# Patient Record
Sex: Female | Born: 1990 | Race: White | Hispanic: No | Marital: Married
Health system: Southern US, Community
[De-identification: ages and names within clinical notes are randomized; demographics above are authoritative.]

## PROBLEM LIST (undated history)

## (undated) DIAGNOSIS — D649 Anemia, unspecified: Secondary | ICD-10-CM

## (undated) DIAGNOSIS — Z789 Other specified health status: Secondary | ICD-10-CM

## (undated) DIAGNOSIS — R87629 Unspecified abnormal cytological findings in specimens from vagina: Secondary | ICD-10-CM

## (undated) HISTORY — PX: TONSILLECTOMY: SUR1361

## (undated) HISTORY — DX: Unspecified abnormal cytological findings in specimens from vagina: R87.629

---
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
## 2009-03-06 HISTORY — PX: TONSILLECTOMY: SUR1361

## 2009-07-15 ENCOUNTER — Emergency Department (HOSPITAL_COMMUNITY): Admission: EM | Admit: 2009-07-15 | Discharge: 2009-07-15 | Payer: Self-pay | Admitting: Emergency Medicine

## 2009-07-16 ENCOUNTER — Ambulatory Visit (HOSPITAL_COMMUNITY): Admission: RE | Admit: 2009-07-16 | Discharge: 2009-07-16 | Payer: Self-pay | Admitting: Emergency Medicine

## 2010-01-14 ENCOUNTER — Emergency Department (HOSPITAL_COMMUNITY): Admission: EM | Admit: 2010-01-14 | Discharge: 2010-01-14 | Payer: Self-pay | Admitting: Family Medicine

## 2010-05-24 LAB — COMPREHENSIVE METABOLIC PANEL
ALT: 23 U/L (ref 0–35)
AST: 24 U/L (ref 0–37)
Albumin: 4 g/dL (ref 3.5–5.2)
Alkaline Phosphatase: 45 U/L (ref 39–117)
BUN: 21 mg/dL (ref 6–23)
CO2: 21 mEq/L (ref 19–32)
Calcium: 9.2 mg/dL (ref 8.4–10.5)
Chloride: 107 mEq/L (ref 96–112)
Creatinine, Ser: 0.83 mg/dL (ref 0.4–1.2)
GFR calc Af Amer: >60 mL/min (ref >60)
GFR calc non Af Amer: >60 mL/min (ref >60)
Glucose, Bld: 96 mg/dL (ref 70–99)
Potassium: 3.4 mEq/L — ABNORMAL LOW (ref 3.5–5.1)
Sodium: 138 mEq/L (ref 135–145)
Total Bilirubin: 0.4 mg/dL (ref 0.3–1.2)
Total Protein: 7.1 g/dL (ref 6.0–8.3)

## 2010-05-24 LAB — DIFFERENTIAL
Basophils Absolute: 0 10*3/uL (ref 0.0–0.1)
Basophils Relative: 0 % (ref 0–1)
Eosinophils Absolute: 0.3 10*3/uL (ref 0.0–0.7)
Eosinophils Relative: 4 % (ref 0–5)
Lymphocytes Relative: 25 % (ref 12–46)
Lymphs Abs: 2.1 10*3/uL (ref 0.7–4.0)
Monocytes Absolute: 0.4 10*3/uL (ref 0.1–1.0)
Monocytes Relative: 5 % (ref 3–12)
Neutro Abs: 5.5 10*3/uL (ref 1.7–7.7)
Neutrophils Relative %: 66 % (ref 43–77)

## 2010-05-24 LAB — URINALYSIS, ROUTINE W REFLEX MICROSCOPIC
Bilirubin Urine: NEGATIVE
Ketones, ur: NEGATIVE mg/dL
Nitrite: NEGATIVE
Protein, ur: NEGATIVE mg/dL
Specific Gravity, Urine: 1.007 (ref 1.005–1.030)
Urobilinogen, UA: 0.2 mg/dL (ref 0.0–1.0)
pH: 5.5 (ref 5.0–8.0)

## 2010-05-24 LAB — CBC
HCT: 39.7 % (ref 36.0–46.0)
Hemoglobin: 13.3 g/dL (ref 12.0–15.0)
MCHC: 33.6 g/dL (ref 30.0–36.0)
MCV: 93.2 fL (ref 78.0–100.0)
Platelets: 193 10*3/uL (ref 150–400)
RBC: 4.26 MIL/uL (ref 3.87–5.11)
RDW: 12.4 % (ref 11.5–15.5)
WBC: 8.3 10*3/uL (ref 4.0–10.5)

## 2011-04-08 IMAGING — US US ABDOMEN COMPLETE
1 series · 14 of 25 positions shown · non-contrast
Comparison: None.

CLINICAL DATA: Epigastric pain

COMPLETE ABDOMINAL ULTRASOUND

[Series 1: us abdomen complete · 0.23mm/px · 14 of 78 slices shown]
[im 1/78]
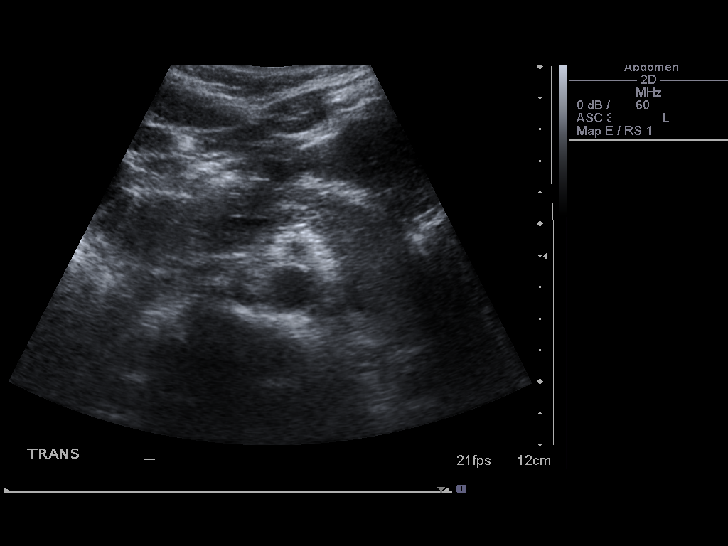
[im 7/78]
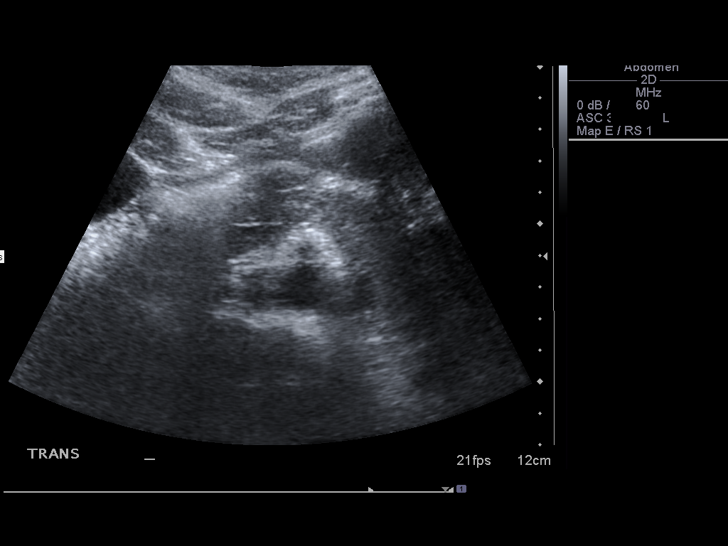
[im 13/78]
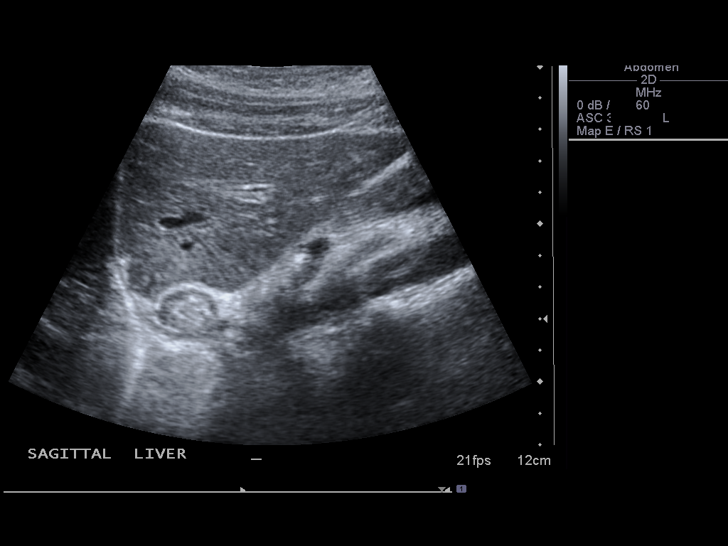
[im 20/78]
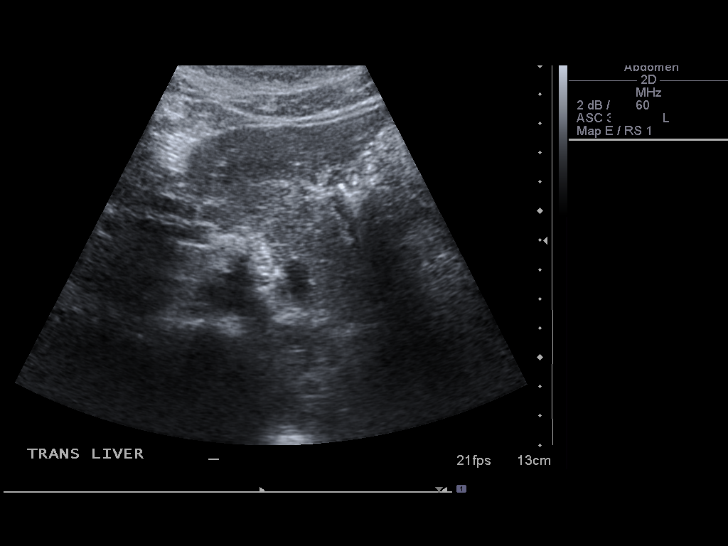
[im 26/78]
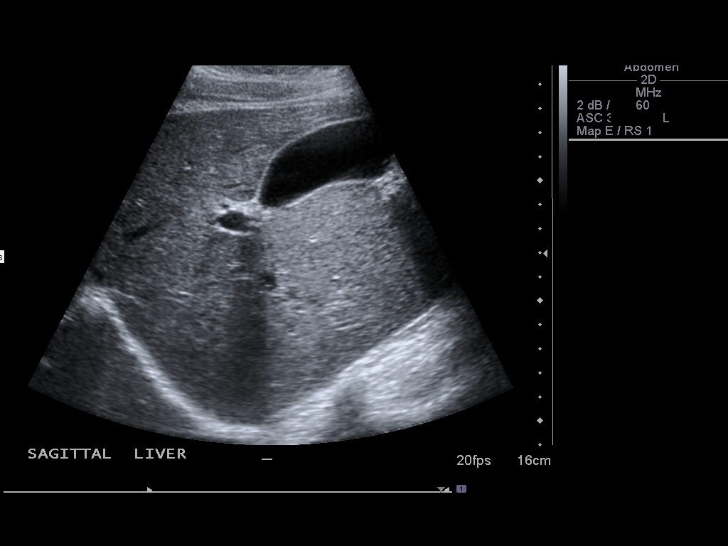
[im 29/78]
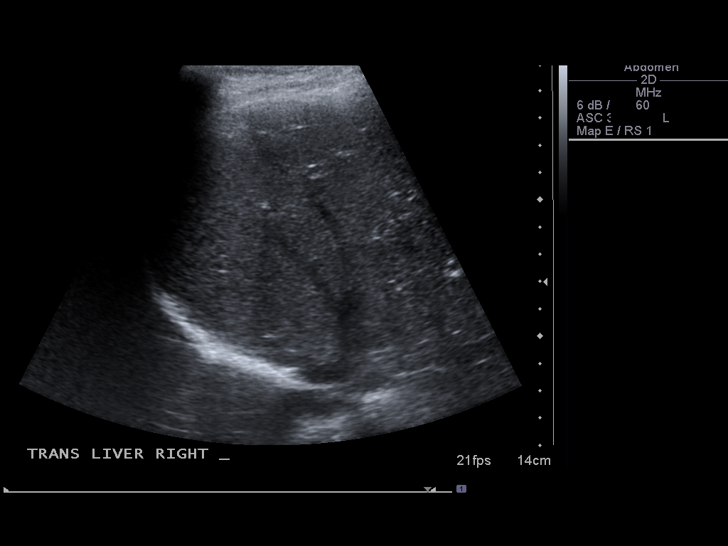
[im 36/78]
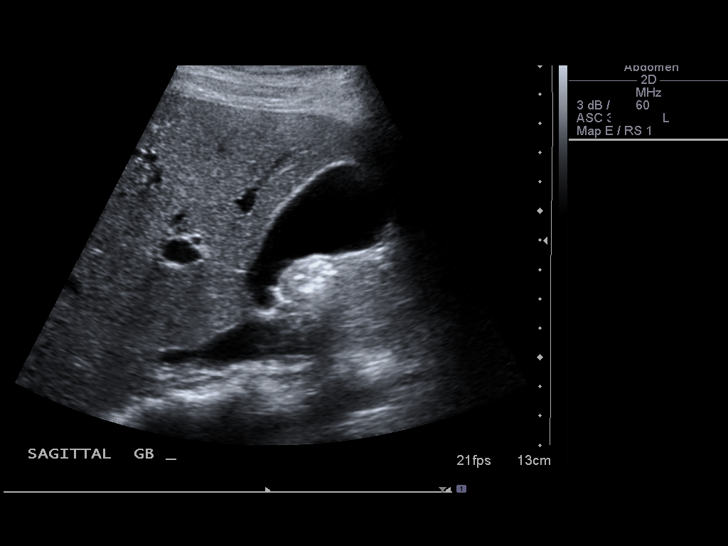
[im 42/78]
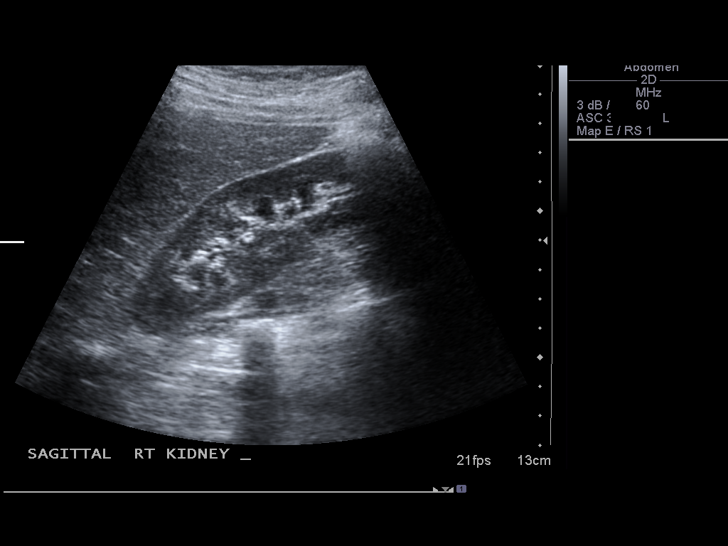
[im 49/78]
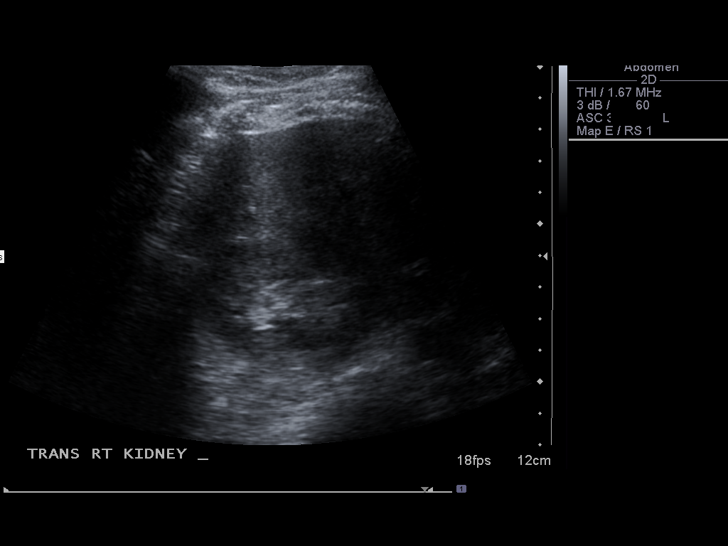
[im 52/78]
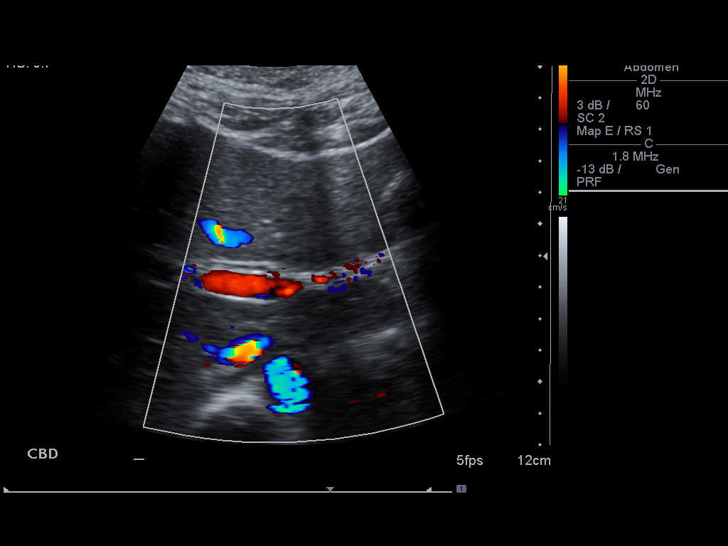
[im 58/78]
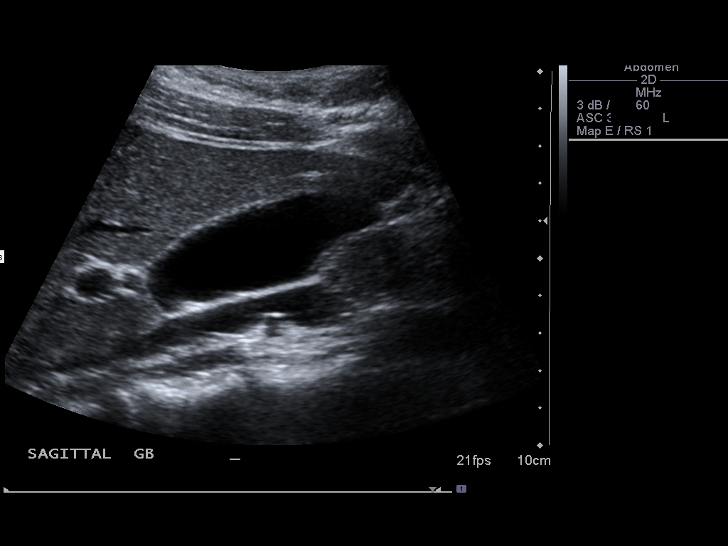
[im 65/78]
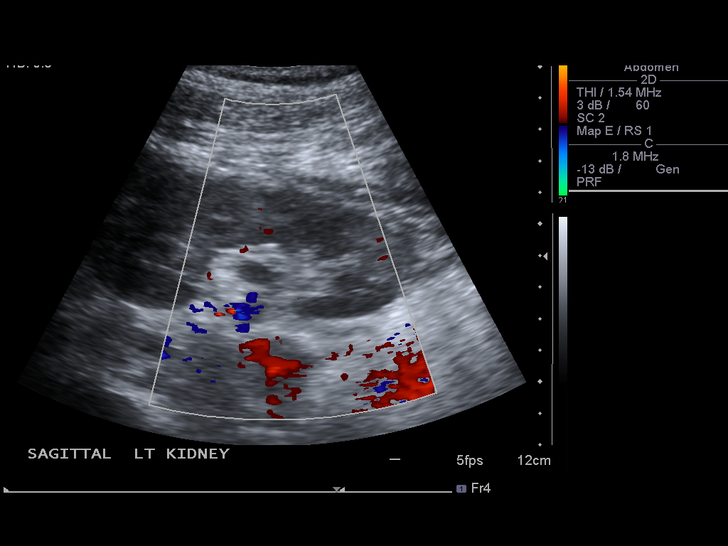
[im 71/78]
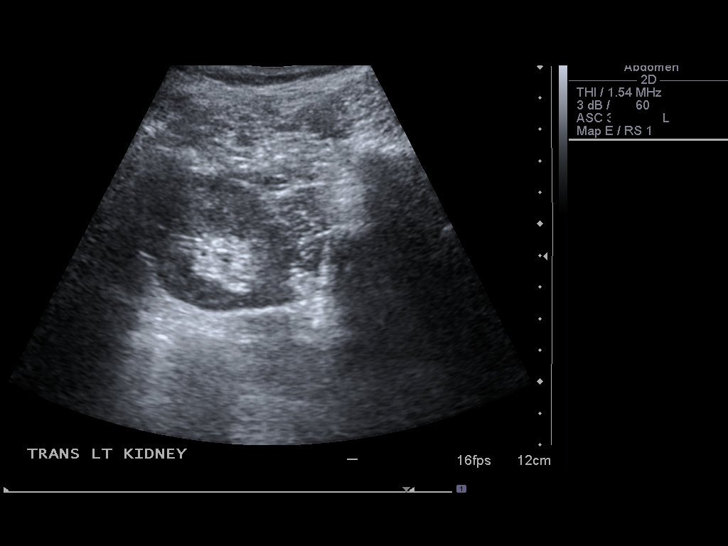
[im 78/78]
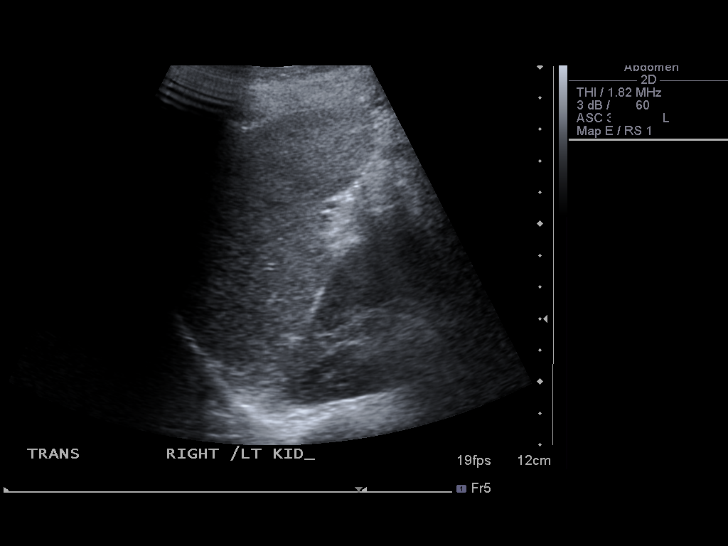

[14 of 25 positions shown; findings below may reference images not displayed]

FINDINGS: Gallbladder:  No gallstones, gallbladder wall thickening, or
pericholecystic fluid.

Common bile duct:  1.4 mm

Liver:  No focal lesion identified.  Within normal limits in
parenchymal echogenicity.

IVC:  Normal

Pancreas:  No focal abnormality seen.

Spleen:  6.5 cm in length.  No lesions.

Right Kidney:  10.6 cm in length.  No hydronephrosis or other
pathology.

Left Kidney:  10.5 cm in length.  No pathology.

Abdominal aorta:  Normal
IMPRESSION: No pathological findings.

## 2013-03-12 LAB — HM PAP SMEAR

## 2013-03-18 ENCOUNTER — Ambulatory Visit (INDEPENDENT_AMBULATORY_CARE_PROVIDER_SITE_OTHER): Payer: 59 | Admitting: Family Medicine

## 2013-03-18 ENCOUNTER — Encounter: Payer: Self-pay | Admitting: Family Medicine

## 2013-03-18 VITALS — Temp 97.9°F | Ht 67.25 in | Wt 150.0 lb

## 2013-03-18 DIAGNOSIS — Z7689 Persons encountering health services in other specified circumstances: Secondary | ICD-10-CM

## 2013-03-18 DIAGNOSIS — K5289 Other specified noninfective gastroenteritis and colitis: Secondary | ICD-10-CM

## 2013-03-18 DIAGNOSIS — Z7189 Other specified counseling: Secondary | ICD-10-CM

## 2013-03-18 DIAGNOSIS — K529 Noninfective gastroenteritis and colitis, unspecified: Secondary | ICD-10-CM

## 2013-03-18 MED ORDER — ONDANSETRON HCL 4 MG PO TABS: 4.0000 mg | ORAL_TABLET | Freq: Three times a day (TID) | ORAL | Status: DC | PRN

## 2013-03-18 NOTE — Progress Notes (Signed)
Chief Complaint  Patient presents with  . Establish Care  . Nausea    diarrhea started this morning     HPI:  Sonia Terry is here to establish care.  Last PCP and physical: [facility], gets yearly physical and utd  Has the following chronic problems and concerns today:  Acute NVD: -diarrhea 2x this morning, nausea -denies: fevers, sore throat, vomiting, hematochezia, abd pain  There are no active problems to display for this patient.  Health Maintenance: -had her flu shot  ROS: See pertinent positives and negatives per HPI.  History reviewed. No pertinent past medical history.  Family History  Problem Relation Age of Onset  . Alcoholism Maternal Grandfather   . Hyperlipidemia Maternal Grandfather   . Hypertension Maternal Grandmother     History   Social History  . Marital Status: Single    Spouse Name: N/A    Number of Children: N/A  . Years of Education: N/A   Social History Main Topics  . Smoking status: None  . Smokeless tobacco: None  . Alcohol Use: Yes     Comment: occ glass wine  . Drug Use: None  . Sexual Activity: Yes    Birth Control/ Protection: Pill   Other Topics Concern  . None   Social History Narrative   Work or School: Investment banker, corporateproperty manager for [employer]      Home Situation:       Spiritual Beliefs: Christian      Lifestyle: regular exercise; diet is healthy                Current outpatient prescriptions:Green Tea, Camillia sinensis, (GREEN TEA EXTRACT PO), Take by mouth as needed., Disp: , Rfl: ;  JUNEL FE 1/20 1-20 MG-MCG tablet, Take 1 tablet by mouth daily. , Disp: , Rfl: ;  Multiple Vitamin (MULTIVITAMIN) tablet, Take 1 tablet by mouth daily., Disp: , Rfl: ;  Ascorbic Acid (VITAMIN C) 100 MG tablet, Take 100 mg by mouth daily., Disp: , Rfl:  ondansetron (ZOFRAN) 4 MG tablet, Take 1 tablet (4 mg total) by mouth every 8 (eight) hours as needed for nausea or vomiting., Disp: 20 tablet, Rfl:  0  EXAM:  Filed Vitals:   03/18/13 0825  Temp: 97.9 F (36.6 C)    Body mass index is 23.32 kg/(m^2).  GENERAL: vitals reviewed and listed above, alert, oriented, appears well hydrated and in no acute distress  HEENT: atraumatic, conjunttiva clear, no obvious abnormalities on inspection of external nose and ears  NECK: no obvious masses on inspection  LUNGS: clear to auscultation bilaterally, no wheezes, rales or rhonchi, good air movement  CV: HRRR, no peripheral edema  ABD: BS+, soft, NTTP  MS: moves all extremities without noticeable abnormality  PSYCH: pleasant and cooperative, no obvious depression or anxiety  ASSESSMENT AND PLAN:  Discussed the following assessment and plan:  Encounter to establish care  Gastroenteritis - Plan: ondansetron (ZOFRAN) 4 MG tablet   -We reviewed the PMH, PSH, FH, SH, Meds and Allergies. -We provided refills for any medications we will prescribe as needed. -We addressed current concerns per orders and patient instructions. -We have asked for records for pertinent exams, studies, vaccines and notes from previous providers. -We have advised patient to follow up per instructions below.   -Patient advised to return or notify a doctor immediately if symptoms worsen or persist or new concerns arise.  Patient Instructions  -We have ordered labs or studies at this visit.  It can take up to 1-2 weeks for results and processing. We will contact you with instructions IF your results are abnormal. Normal results will be released to your [facility]. If you have not heard from [place] or can not find your results in [facility] in 2 weeks please contact our office.  -PLEASE SIGN UP FOR MYCHART TODAY   We recommend the following healthy lifestyle measures: - eat a healthy diet consisting of lots of vegetables, fruits, beans, nuts, seeds, healthy meats such as white chicken and fish and whole grains.  - avoid fried foods, fast food, processed foods, sodas,  red meet and other fattening foods.  - get a least 150 minutes of aerobic exercise per week.   Take zofran if needed for nausea  Loperamide for diarrhea if needed  Plenty of fluids  Follow up as needed      Dixon Luczak R.

## 2013-03-18 NOTE — Patient Instructions (Signed)
-  We have ordered labs or studies at this visit. It can take up to 1-2 weeks for results and processing. We will contact you with instructions IF your results are abnormal. Normal results will be released to your [facility]. If you have not heard from us or can not find your results in [facility] in 2 weeks please contact our office.  -PLEASE SIGN UP FOR MYCHART TODAY   We recommend the following healthy lifestyle measures: - eat a healthy diet consisting of lots of vegetables, fruits, beans, nuts, seeds, healthy meats such as white chicken and fish and whole grains.  - avoid fried foods, fast food, processed foods, sodas, red meet and other fattening foods.  - get a least 150 minutes of aerobic exercise per week.   Take zofran if needed for nausea  Loperamide for diarrhea if needed  Plenty of fluids  Follow up as needed

## 2013-03-18 NOTE — Progress Notes (Signed)
Pre visit review using our clinic review tool, if applicable. No additional management support is needed unless otherwise documented below in the visit note. 

## 2013-05-05 ENCOUNTER — Ambulatory Visit (INDEPENDENT_AMBULATORY_CARE_PROVIDER_SITE_OTHER): Payer: 59 | Admitting: Family Medicine

## 2013-05-05 ENCOUNTER — Encounter: Payer: Self-pay | Admitting: Family Medicine

## 2013-05-05 VITALS — BP 90/68 | Temp 99.0°F | Wt 162.0 lb

## 2013-05-05 DIAGNOSIS — J069 Acute upper respiratory infection, unspecified: Secondary | ICD-10-CM

## 2013-05-05 NOTE — Progress Notes (Signed)
Pre visit review using our clinic review tool, if applicable. No additional management support is needed unless otherwise documented below in the visit note. 

## 2013-05-05 NOTE — Progress Notes (Signed)
Chief Complaint  Patient presents with  . Headache    mucus congestion, scratchy throat, ear congestion    HPI:  -started: 3 days ago -symptoms:nasal congestion, sore throat, cough and above -denies:fever, SOB, NVD, tooth pain -has tried: OTC cold medication -sick contacts/travel/risks: denies flu exposure or Ebola risks  ROS: See pertinent positives and negatives per HPI.  No past medical history on file.  Past Surgical History  Procedure Laterality Date  . Tonsillectomy  2011    Family History  Problem Relation Age of Onset  . Alcoholism Maternal Grandfather   . Hyperlipidemia Maternal Grandfather   . Hypertension Maternal Grandmother     History   Social History  . Marital Status: Single    Spouse Name: N/A    Number of Children: N/A  . Years of Education: N/A   Social History Main Topics  . Smoking status: Never Smoker   . Smokeless tobacco: Not on file  . Alcohol Use: Yes     Comment: occ glass wine  . Drug Use: Not on file  . Sexual Activity: Yes    Birth Control/ Protection: Pill   Other Topics Concern  . 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 , Disp: , Rfl: ;  Multiple Vitamin (MULTIVITAMIN) tablet, Take 1 tablet by mouth daily., Disp: , Rfl:  ondansetron (ZOFRAN) 4 MG tablet, Take 1 tablet (4 mg total) by mouth every 8 (eight) hours as needed for nausea or vomiting., Disp: 20 tablet, Rfl: 0  EXAM:  Filed Vitals:   05/05/13 1413  BP: 90/68  Temp: 99 F (37.2 C)    Body mass index is 25.19  kg/(m^2).  GENERAL: vitals reviewed and listed above, alert, oriented, appears well hydrated and in no acute distress  HEENT: atraumatic, conjunttiva clear, no obvious abnormalities on inspection of external nose and ears, normal appearance of ear canals and TMs, clear nasal congestion, mild post oropharyngeal erythema with PND, no tonsillar edema or exudate, no sinus TTP  NECK: no obvious masses on inspection  LUNGS: clear to auscultation bilaterally, no wheezes, rales or rhonchi, good air movement  CV: HRRR, no peripheral edema  MS: moves all extremities without noticeable abnormality  PSYCH: pleasant and cooperative, no obvious depression or anxiety  ASSESSMENT AND PLAN:  Discussed the following assessment and plan:  Upper respiratory infection  -given HPI and exam findings today, a serious infection or illness is unlikely. We discussed potential etiologies, with VURI being most likely, and advised supportive care and monitoring. We discussed treatment side effects, likely course, antibiotic misuse, transmission, and signs of developing a serious illness. -of course, we advised to return or notify a doctor immediately if symptoms worsen or persist or new concerns arise.    Patient Instructions  INSTRUCTIONS FOR UPPER RESPIRATORY INFECTION:  -plenty of rest and fluids  -nasal saline wash 2-3 times daily (use prepackaged nasal saline or bottled/distilled water  if making your own)   -clean nose with nasal saline before using the nasal steroid or sinex  -nasocort for 21 days  -can use sinex or afrin nasal spray for drainage and nasal congestion - but do NOT use longer then 3-4 days  -can use tylenol or ibuprofen as directed for aches and sorethroat  -in the winter time, using a humidifier at night is helpful (please follow cleaning instructions)  -if you are taking a cough medication - use only as directed, may also try a teaspoon of honey to coat the throat and throat  lozenges  -for sore throat, salt water gargles can help  -follow up if you have fevers, facial pain, tooth pain, difficulty breathing or are worsening or not getting better in 5-7 days      Oretha Weismann R.

## 2013-05-05 NOTE — Patient Instructions (Signed)
INSTRUCTIONS FOR UPPER RESPIRATORY INFECTION:  -plenty of rest and fluids  -nasal saline wash 2-3 times daily (use prepackaged nasal saline or bottled/distilled water if making your own)   -clean nose with nasal saline before using the nasal steroid or sinex  -nasocort for 21 days  -can use sinex or afrin nasal spray for drainage and nasal congestion - but do NOT use longer then 3-4 days  -can use tylenol or ibuprofen as directed for aches and sorethroat  -in the winter time, using a humidifier at night is helpful (please follow cleaning instructions)  -if you are taking a cough medication - use only as directed, may also try a teaspoon of honey to coat the throat and throat lozenges  -for sore throat, salt water gargles can help  -follow up if you have fevers, facial pain, tooth pain, difficulty breathing or are worsening or not getting better in 5-7 days

## 2013-05-29 ENCOUNTER — Telehealth: Payer: Self-pay | Admitting: Internal Medicine

## 2013-05-29 NOTE — Telephone Encounter (Signed)
Attemped to call pt and "talk" appeared but no one was on the line.

## 2013-07-31 ENCOUNTER — Ambulatory Visit (INDEPENDENT_AMBULATORY_CARE_PROVIDER_SITE_OTHER): Payer: 59 | Admitting: Family Medicine

## 2013-07-31 ENCOUNTER — Encounter: Payer: Self-pay | Admitting: Family Medicine

## 2013-07-31 VITALS — BP 102/76 | HR 69 | Temp 98.2°F | Ht 67.25 in

## 2013-07-31 DIAGNOSIS — R6889 Other general symptoms and signs: Secondary | ICD-10-CM

## 2013-07-31 LAB — BASIC METABOLIC PANEL
BUN: 19 mg/dL (ref 6–23)
CALCIUM: 9.2 mg/dL (ref 8.4–10.5)
CO2: 24 mEq/L (ref 19–32)
CREATININE: 0.9 mg/dL (ref 0.4–1.2)
Chloride: 109 mEq/L (ref 96–112)
GFR: 80.96 mL/min (ref >60.00)
Glucose, Bld: 79 mg/dL (ref 70–99)
Potassium: 3.8 mEq/L (ref 3.5–5.1)
SODIUM: 139 meq/L (ref 135–145)

## 2013-07-31 LAB — CBC WITH DIFFERENTIAL/PLATELET
BASOS ABS: 0 10*3/uL (ref 0.0–0.1)
BASOS PCT: 0.6 % (ref 0.0–3.0)
Eosinophils Absolute: 0.2 10*3/uL (ref 0.0–0.7)
Eosinophils Relative: 2.9 % (ref 0.0–5.0)
HCT: 36.1 % (ref 36.0–46.0)
HEMOGLOBIN: 12.4 g/dL (ref 12.0–15.0)
LYMPHS PCT: 29.8 % (ref 12.0–46.0)
Lymphs Abs: 1.8 10*3/uL (ref 0.7–4.0)
MCHC: 34.2 g/dL (ref 30.0–36.0)
MCV: 92.2 fl (ref 78.0–100.0)
MONOS PCT: 4 % (ref 3.0–12.0)
Monocytes Absolute: 0.2 10*3/uL (ref 0.1–1.0)
NEUTROS ABS: 3.8 10*3/uL (ref 1.4–7.7)
NEUTROS PCT: 62.7 % (ref 43.0–77.0)
Platelets: 197 10*3/uL (ref 150.0–400.0)
RBC: 3.91 Mil/uL (ref 3.87–5.11)
RDW: 13.3 % (ref 11.5–15.5)
WBC: 6.1 10*3/uL (ref 4.0–10.5)

## 2013-07-31 LAB — LIPID PANEL
Cholesterol: 190 mg/dL (ref 0–200)
HDL: 74.9 mg/dL (ref >39.00)
LDL Cholesterol: 100 mg/dL — ABNORMAL HIGH (ref 0–99)
TRIGLYCERIDES: 78 mg/dL (ref 0.0–149.0)
Total CHOL/HDL Ratio: 3
VLDL: 15.6 mg/dL (ref 0.0–40.0)

## 2013-07-31 LAB — T4, FREE: FREE T4: 0.7 ng/dL (ref 0.60–1.60)

## 2013-07-31 LAB — HEMOGLOBIN A1C: HEMOGLOBIN A1C: 5.1 % (ref 4.6–6.5)

## 2013-07-31 LAB — TSH: TSH: 1.34 u[IU]/mL (ref 0.35–4.50)

## 2013-07-31 NOTE — Progress Notes (Signed)
No chief complaint on file.   HPI:  Follow up:  1) ? Thyroid issues: -always cold and feels like hair doesn't grow well, sometimes tired -feels like struggles with weight -denies: constipation, fevers, swelling, hair loss, skin problems, palpitations  ROS: See pertinent positives and negatives per HPI.  No past medical history on file.  Past Surgical History  Procedure Laterality Date  . Tonsillectomy  2011    Family History  Problem Relation Age of Onset  . Alcoholism Maternal Grandfather   . Hyperlipidemia Maternal Grandfather   . Hypertension Maternal Grandmother     History   Social History  . Marital Status: Single    Spouse Name: N/A    Number of Children: N/A  . Years of Education: N/A   Social History Main Topics  . Smoking status: Never Smoker   . Smokeless tobacco: None  . Alcohol Use: Yes     Comment: occ glass wine  . Drug Use: None  . Sexual Activity: Yes    Birth Control/ Protection: Pill   Other Topics Concern  . None   Social History Narrative   Work or School: Investment banker, corporate for [employer]      Home Situation:       Spiritual Beliefs: Christian      Lifestyle: regular exercise; diet is healthy                Current outpatient prescriptions:Green Tea, Camillia sinensis, (GREEN TEA EXTRACT PO), Take by mouth as needed., Disp: , Rfl: ;  JUNEL FE 1/20 1-20 MG-MCG tablet, Take 1 tablet by mouth daily. , Disp: , Rfl: ;  NON FORMULARY, Elcarnitine, Disp: , Rfl:   EXAM:  Filed Vitals:   07/31/13 0812  BP: 102/76  Pulse: 69  Temp: 98.2 F (36.8 C)    Body mass index is 0.00 kg/(m^2).  GENERAL: vitals reviewed and listed above, alert, oriented, appears well hydrated and in no acute distress  HEENT: atraumatic, conjunttiva clear, no obvious abnormalities on inspection of external nose and ears  NECK: no obvious masses on inspection  LUNGS: clear to auscultation bilaterally, no wheezes, rales or rhonchi, good air  movement  CV: HRRR, no peripheral edema  MS: moves all extremities without noticeable abnormality  PSYCH: pleasant and cooperative, no obvious depression or anxiety  ASSESSMENT AND PLAN:  Discussed the following assessment and plan:  Cold intolerance - Plan: TSH, T4, Free, Basic metabolic panel, CBC with Differential, Lipid Panel, Hemoglobin A1c  -we discussed possible serious and likely etiologies, workup and treatment, treatment risks and return precautions -after this discussion, Sonia Terry opted for labs per orders -follow up advised as needed and pending labs -of course, we advised Sonia Terry  to return or notify a doctor immediately if symptoms worsen or persist or new concerns arise.  .  -Patient advised to return or notify a doctor immediately if symptoms worsen or persist or new concerns arise.  Patient Instructions  -We have ordered labs or studies at this visit. It can take up to 1-2 weeks for results and processing. We will contact you with instructions IF your results are abnormal. Normal results will be released to your [facility]. If you have not heard from [place] or can not find your results in [facility] in 2 weeks please contact our office.            Terressa Koyanagi

## 2013-07-31 NOTE — Patient Instructions (Signed)
-  We have ordered labs or studies at this visit. It can take up to 1-2 weeks for results and processing. We will contact you with instructions IF your results are abnormal. Normal results will be released to your MYCHART. If you have not heard from us or can not find your results in MYCHART in 2 weeks please contact our office.        

## 2013-07-31 NOTE — Progress Notes (Signed)
Pre visit review using our clinic review tool, if applicable. No additional management support is needed unless otherwise documented below in the visit note. 

## 2013-08-18 ENCOUNTER — Encounter: Payer: Self-pay | Admitting: Family Medicine

## 2013-12-19 ENCOUNTER — Other Ambulatory Visit: Payer: Self-pay

## 2016-03-06 NOTE — L&D Delivery Note (Signed)
Delivery Note Pt pushed for a little over an hour and at 3:19 PM a healthy female was delivered via Vaginal, Spontaneous Delivery (Presentation: ROA  ).  APGAR: 9, 9; weight  pending.   Placenta status: delivered spontraneously .  Cord:  with the following complications: None Compound presentation of left arm. Baby had copious terminal meconium right after delivery.  Anesthesia:  Epidural Episiotomy: None Lacerations: 1st degree;Periurethral Suture Repair: 3.0 vicryl rapide Est. Blood Loss (mL):  275mL Mom to postpartum.  Baby to Couplet care / Skin to Skin. D/w pt circumcision and they desire to proceed in the hospital and have paid at office.  Oliver PilaKathy W Tyyne Cliett 12/20/2016, 3:56 PM

## 2016-05-26 LAB — OB RESULTS CONSOLE ANTIBODY SCREEN: Antibody Screen: NEGATIVE

## 2016-05-26 LAB — OB RESULTS CONSOLE GC/CHLAMYDIA
CHLAMYDIA, DNA PROBE: NEGATIVE
Gonorrhea: NEGATIVE

## 2016-05-26 LAB — OB RESULTS CONSOLE HIV ANTIBODY (ROUTINE TESTING): HIV: NONREACTIVE

## 2016-05-26 LAB — OB RESULTS CONSOLE HEPATITIS B SURFACE ANTIGEN: HEP B S AG: NEGATIVE

## 2016-05-26 LAB — OB RESULTS CONSOLE RPR: RPR: NONREACTIVE

## 2016-05-26 LAB — OB RESULTS CONSOLE RUBELLA ANTIBODY, IGM: RUBELLA: IMMUNE

## 2016-05-26 LAB — OB RESULTS CONSOLE ABO/RH: RH TYPE: POSITIVE

## 2016-11-22 LAB — OB RESULTS CONSOLE GBS: GBS: NEGATIVE

## 2016-11-23 ENCOUNTER — Encounter: Payer: Self-pay | Admitting: Family Medicine

## 2016-12-20 ENCOUNTER — Inpatient Hospital Stay (HOSPITAL_COMMUNITY): Payer: 59 | Admitting: Anesthesiology

## 2016-12-20 ENCOUNTER — Encounter (HOSPITAL_COMMUNITY): Payer: Self-pay | Admitting: *Deleted

## 2016-12-20 ENCOUNTER — Inpatient Hospital Stay (HOSPITAL_COMMUNITY)
Admission: AD | Admit: 2016-12-20 | Discharge: 2016-12-22 | DRG: 807 | Disposition: A | Discharge status: Home / Self Care | Payer: 59 | Source: Ambulatory Visit | Attending: Obstetrics and Gynecology | Admitting: Obstetrics and Gynecology

## 2016-12-20 DIAGNOSIS — Z3A39 39 weeks gestation of pregnancy: Secondary | ICD-10-CM

## 2016-12-20 DIAGNOSIS — O26893 Other specified pregnancy related conditions, third trimester: Secondary | ICD-10-CM | POA: Diagnosis present

## 2016-12-20 DIAGNOSIS — O326XX0 Maternal care for compound presentation, not applicable or unspecified: Secondary | ICD-10-CM | POA: Diagnosis present

## 2016-12-20 HISTORY — DX: Other specified health status: Z78.9

## 2016-12-20 LAB — RPR: RPR Ser Ql: NONREACTIVE

## 2016-12-20 LAB — ABO/RH: ABO/RH(D): O POS

## 2016-12-20 LAB — CBC
HEMATOCRIT: 32.8 % — AB (ref 36.0–46.0)
HEMOGLOBIN: 11.1 g/dL — AB (ref 12.0–15.0)
MCH: 30.5 pg (ref 26.0–34.0)
MCHC: 33.8 g/dL (ref 30.0–36.0)
MCV: 90.1 fL (ref 78.0–100.0)
Platelets: 163 10*3/uL (ref 150–400)
RBC: 3.64 MIL/uL — ABNORMAL LOW (ref 3.87–5.11)
RDW: 14.3 % (ref 11.5–15.5)
WBC: 12.6 10*3/uL — ABNORMAL HIGH (ref 4.0–10.5)

## 2016-12-20 LAB — TYPE AND SCREEN
ABO/RH(D): O POS
Antibody Screen: NEGATIVE

## 2016-12-20 MED ORDER — IBUPROFEN 600 MG PO TABS
600.0000 mg | ORAL_TABLET | Freq: Four times a day (QID) | ORAL | Status: DC
  Administered 2016-12-20 – 2016-12-22 (×8): 600 mg via ORAL
  Filled 2016-12-20 (×8): qty 1

## 2016-12-20 MED ORDER — ZOLPIDEM TARTRATE 5 MG PO TABS: 5.0000 mg | ORAL_TABLET | Freq: Every evening | ORAL | Status: DC | PRN

## 2016-12-20 MED ORDER — LIDOCAINE HCL (PF) 1 % IJ SOLN
30.0000 mL | INTRAMUSCULAR | Status: DC | PRN
  Filled 2016-12-20: qty 30

## 2016-12-20 MED ORDER — FLEET ENEMA 7-19 GM/118ML RE ENEM: 1.0000 | ENEMA | RECTAL | Status: DC | PRN

## 2016-12-20 MED ORDER — ACETAMINOPHEN 325 MG PO TABS: 650.0000 mg | ORAL_TABLET | ORAL | Status: DC | PRN

## 2016-12-20 MED ORDER — EPHEDRINE 5 MG/ML INJ
10.0000 mg | INTRAVENOUS | Status: DC | PRN
  Filled 2016-12-20: qty 2

## 2016-12-20 MED ORDER — BENZOCAINE-MENTHOL 20-0.5 % EX AERO
1.0000 "application " | INHALATION_SPRAY | CUTANEOUS | Status: DC | PRN
  Administered 2016-12-20 – 2016-12-21 (×2): 1 via TOPICAL
  Filled 2016-12-20 (×2): qty 56

## 2016-12-20 MED ORDER — OXYCODONE-ACETAMINOPHEN 5-325 MG PO TABS: 2.0000 | ORAL_TABLET | ORAL | Status: DC | PRN

## 2016-12-20 MED ORDER — TERBUTALINE SULFATE 1 MG/ML IJ SOLN
0.2500 mg | Freq: Once | INTRAMUSCULAR | Status: DC | PRN
  Filled 2016-12-20: qty 1

## 2016-12-20 MED ORDER — SIMETHICONE 80 MG PO CHEW: 80.0000 mg | CHEWABLE_TABLET | ORAL | Status: DC | PRN

## 2016-12-20 MED ORDER — DIBUCAINE 1 % RE OINT
1.0000 "application " | TOPICAL_OINTMENT | RECTAL | Status: DC | PRN
  Administered 2016-12-20: 1 via RECTAL
  Filled 2016-12-20: qty 28

## 2016-12-20 MED ORDER — PRENATAL MULTIVITAMIN CH
1.0000 | ORAL_TABLET | Freq: Every day | ORAL | Status: DC
  Administered 2016-12-21 – 2016-12-22 (×2): 1 via ORAL
  Filled 2016-12-20 (×2): qty 1

## 2016-12-20 MED ORDER — COCONUT OIL OIL
1.0000 "application " | TOPICAL_OIL | Status: DC | PRN
  Administered 2016-12-22: 1 via TOPICAL
  Filled 2016-12-20: qty 120

## 2016-12-20 MED ORDER — SOD CITRATE-CITRIC ACID 500-334 MG/5ML PO SOLN: 30.0000 mL | ORAL | Status: DC | PRN

## 2016-12-20 MED ORDER — TETANUS-DIPHTH-ACELL PERTUSSIS 5-2.5-18.5 LF-MCG/0.5 IM SUSP: 0.5000 mL | Freq: Once | INTRAMUSCULAR | Status: DC

## 2016-12-20 MED ORDER — FENTANYL 2.5 MCG/ML BUPIVACAINE 1/10 % EPIDURAL INFUSION (WH - ANES)
INTRAMUSCULAR | Status: AC
  Filled 2016-12-20: qty 100

## 2016-12-20 MED ORDER — OXYCODONE HCL 5 MG PO TABS: 5.0000 mg | ORAL_TABLET | ORAL | Status: DC | PRN

## 2016-12-20 MED ORDER — LACTATED RINGERS IV SOLN
INTRAVENOUS | Status: DC
  Administered 2016-12-20: 03:00:00 via INTRAVENOUS

## 2016-12-20 MED ORDER — PHENYLEPHRINE 40 MCG/ML (10ML) SYRINGE FOR IV PUSH (FOR BLOOD PRESSURE SUPPORT)
PREFILLED_SYRINGE | INTRAVENOUS | Status: AC
  Filled 2016-12-20: qty 20

## 2016-12-20 MED ORDER — PHENYLEPHRINE 40 MCG/ML (10ML) SYRINGE FOR IV PUSH (FOR BLOOD PRESSURE SUPPORT)
80.0000 ug | PREFILLED_SYRINGE | INTRAVENOUS | Status: DC | PRN
  Filled 2016-12-20: qty 5

## 2016-12-20 MED ORDER — LIDOCAINE HCL (PF) 1 % IJ SOLN
INTRAMUSCULAR | Status: AC
  Filled 2016-12-20: qty 30

## 2016-12-20 MED ORDER — LACTATED RINGERS IV SOLN
500.0000 mL | INTRAVENOUS | Status: DC | PRN
  Administered 2016-12-20: 500 mL via INTRAVENOUS

## 2016-12-20 MED ORDER — ONDANSETRON HCL 4 MG/2ML IJ SOLN: 4.0000 mg | INTRAMUSCULAR | Status: DC | PRN

## 2016-12-20 MED ORDER — OXYCODONE-ACETAMINOPHEN 5-325 MG PO TABS: 1.0000 | ORAL_TABLET | ORAL | Status: DC | PRN

## 2016-12-20 MED ORDER — LIDOCAINE HCL (PF) 1 % IJ SOLN
INTRAMUSCULAR | Status: DC | PRN
  Administered 2016-12-20 (×2): 5 mL

## 2016-12-20 MED ORDER — OXYTOCIN BOLUS FROM INFUSION
500.0000 mL | Freq: Once | INTRAVENOUS | Status: AC
  Administered 2016-12-20: 500 mL via INTRAVENOUS

## 2016-12-20 MED ORDER — WITCH HAZEL-GLYCERIN EX PADS
1.0000 "application " | MEDICATED_PAD | CUTANEOUS | Status: DC | PRN
  Administered 2016-12-20: 1 via TOPICAL

## 2016-12-20 MED ORDER — BUTORPHANOL TARTRATE 1 MG/ML IJ SOLN
INTRAMUSCULAR | Status: AC
  Filled 2016-12-20: qty 1

## 2016-12-20 MED ORDER — ONDANSETRON HCL 4 MG/2ML IJ SOLN: 4.0000 mg | Freq: Four times a day (QID) | INTRAMUSCULAR | Status: DC | PRN

## 2016-12-20 MED ORDER — OXYTOCIN 40 UNITS IN LACTATED RINGERS INFUSION - SIMPLE MED
1.0000 m[IU]/min | INTRAVENOUS | Status: DC
  Administered 2016-12-20: 2 m[IU]/min via INTRAVENOUS

## 2016-12-20 MED ORDER — DIPHENHYDRAMINE HCL 25 MG PO CAPS: 25.0000 mg | ORAL_CAPSULE | Freq: Four times a day (QID) | ORAL | Status: DC | PRN

## 2016-12-20 MED ORDER — SODIUM BICARBONATE 8.4 % IV SOLN
INTRAVENOUS | Status: DC | PRN
  Administered 2016-12-20: 5 mL via EPIDURAL

## 2016-12-20 MED ORDER — OXYCODONE HCL 5 MG PO TABS: 10.0000 mg | ORAL_TABLET | ORAL | Status: DC | PRN

## 2016-12-20 MED ORDER — OXYTOCIN 40 UNITS IN LACTATED RINGERS INFUSION - SIMPLE MED
INTRAVENOUS | Status: AC
  Filled 2016-12-20: qty 1000

## 2016-12-20 MED ORDER — OXYTOCIN 40 UNITS IN LACTATED RINGERS INFUSION - SIMPLE MED: 2.5000 [IU]/h | INTRAVENOUS | Status: DC

## 2016-12-20 MED ORDER — SENNOSIDES-DOCUSATE SODIUM 8.6-50 MG PO TABS
2.0000 | ORAL_TABLET | ORAL | Status: DC
  Administered 2016-12-20 – 2016-12-21 (×2): 2 via ORAL
  Filled 2016-12-20 (×2): qty 2

## 2016-12-20 MED ORDER — BUTORPHANOL TARTRATE 1 MG/ML IJ SOLN
1.0000 mg | Freq: Once | INTRAMUSCULAR | Status: AC
  Administered 2016-12-20: 1 mg via INTRAVENOUS

## 2016-12-20 MED ORDER — ONDANSETRON HCL 4 MG PO TABS: 4.0000 mg | ORAL_TABLET | ORAL | Status: DC | PRN

## 2016-12-20 MED ORDER — FENTANYL 2.5 MCG/ML BUPIVACAINE 1/10 % EPIDURAL INFUSION (WH - ANES)
14.0000 mL/h | INTRAMUSCULAR | Status: DC | PRN
  Administered 2016-12-20 (×2): 14 mL/h via EPIDURAL
  Filled 2016-12-20: qty 100

## 2016-12-20 MED ORDER — LACTATED RINGERS IV SOLN
500.0000 mL | Freq: Once | INTRAVENOUS | Status: AC
  Administered 2016-12-20: 500 mL via INTRAVENOUS

## 2016-12-20 MED ORDER — DIPHENHYDRAMINE HCL 50 MG/ML IJ SOLN: 12.5000 mg | INTRAMUSCULAR | Status: DC | PRN

## 2016-12-20 NOTE — Anesthesia Procedure Notes (Signed)
Epidural Patient location during procedure: OB  Staffing Anesthesiologist: Nashawn Hillock Performed: anesthesiologist   Preanesthetic Checklist Completed: patient identified, site marked, surgical consent, pre-op evaluation, timeout performed, IV checked, risks and benefits discussed and monitors and equipment checked  Epidural Patient position: sitting Prep: DuraPrep Patient monitoring: heart rate, continuous pulse ox and blood pressure Approach: right paramedian Location: L3-L4 Injection technique: LOR saline  Needle:  Needle type: Tuohy  Needle gauge: 17 G Needle length: 9 cm and 9 Needle insertion depth: 6 cm Catheter type: closed end flexible Catheter size: 20 Guage Catheter at skin depth: 10 cm Test dose: negative  Assessment Events: blood not aspirated, injection not painful, no injection resistance, negative IV test and no paresthesia  Additional Notes Patient identified. Risks/Benefits/Options discussed with patient including but not limited to bleeding, infection, nerve damage, paralysis, failed block, incomplete pain control, headache, blood pressure changes, nausea, vomiting, reactions to medication both or allergic, itching and postpartum back pain. Confirmed with bedside nurse the patient's most recent platelet count. Confirmed with patient that they are not currently taking any anticoagulation, have any bleeding history or any family history of bleeding disorders. Patient expressed understanding and wished to proceed. All questions were answered. Sterile technique was used throughout the entire procedure. Please see nursing notes for vital signs. Test dose was given through epidural needle and negative prior to continuing to dose epidural or start infusion. Warning signs of high block given to the patient including shortness of breath, tingling/numbness in hands, complete motor block, or any concerning symptoms with instructions to call for help. Patient was given  instructions on fall risk and not to get out of bed. All questions and concerns addressed with instructions to call with any issues.     

## 2016-12-20 NOTE — H&P (Signed)
Sonia MilletMegan Blea is a 26 y.o. female G1P0 at 5839 5/7 weeks (EDD 12/22/16 by LMP c/w 9 week US)  who presented in latent phase labor for last 2 days with cervical change to 4-5 cm from office exam of 4 cm yesterday and increasingly strong contractions.  Received an epidural and comfortable.  She had a low lying placenta and resolved. 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 Medical history non-contributory    Past Surgical History:  Procedure Laterality Date  . TONSILLECTOMY     Family History: family history is not on file. Social History:  reports that she has never smoked. She has never used smokeless tobacco. She reports that she does not use drugs. Her alcohol history is not on file.     Maternal Diabetes: No Genetic Screening: Declined Maternal Ultrasounds/Referrals: Normal Fetal Ultrasounds or other Referrals:  None Maternal Substance Abuse:  No Significant Maternal Medications:  None Significant Maternal Lab Results:  None Other Comments:  None  Review of Systems  Gastrointestinal: Negative for abdominal pain.   Maternal Medical History:  Reason for admission: Contractions.   Contractions: Onset was 2 days ago.   Frequency: regular.   Perceived severity is moderate.    Fetal activity: Perceived fetal activity is normal.    Prenatal Complications - Diabetes: none.    Dilation: 6.5 Effacement (%): 90 Station: 0 Exam by:: Dr. Senaida Oresichardson Blood pressure 115/80, pulse 83, temperature 99.8 F (37.7 C), temperature source Oral, resp. rate 18, SpO2 99 %. Maternal Exam:  Uterine Assessment: Contraction strength is moderate.  Contraction frequency is regular.   Abdomen: Patient reports no abdominal tenderness. Fetal presentation: vertex  Introitus: Normal vulva. Normal  vagina.    Physical Exam  Constitutional: She appears well-developed.  Cardiovascular: Normal rate and regular rhythm.   Respiratory: Effort normal.  GI: Soft.  Genitourinary: Vagina normal.  Neurological: She is alert.  Psychiatric: She has a normal mood and affect.   Cervix 90/6+/0 AROM clear  Prenatal labs: ABO, Rh: --/--/O POS, O POS (10/17 0230) Antibody: NEG (10/17 0230) Rubella: Immune (03/23 0000) RPR: Nonreactive (03/23 0000)  HBsAg: Negative (03/23 0000)  HIV: Non-reactive (03/23 0000)  GBS: Negative (09/19 0000)  One hour GCT 144 Three hour GTT WNL  Assessment/Plan: Pt here with labor slowly progressing to active phase.  AROM performed and IUPC placed.  Had been augmented with pitocin overnight and is only on 2 mu.  Will follow progress    Oliver PilaKathy W Kennidy Lamke 12/20/2016, 8:46 AM

## 2016-12-20 NOTE — Anesthesia Preprocedure Evaluation (Signed)

## 2016-12-20 NOTE — H&P (Signed)
Sonia MilletMegan Terry is a 26 y.o. female G1P0 at 39+ with ctx for 48hours and cervical change.  Pt originally wanted natural labor, however admitted knowing she might need to be augmented.   Please see Dr Berenda Moraleichardson's H&P OB History    Gravida Para Term Preterm AB Living   1             SAB TAB Ectopic Multiple Live Births                G1 present  Past Medical History:  Diagnosis Date  . Medical history non-contributory    Past Surgical History:  Procedure Laterality Date  . TONSILLECTOMY     Family History: family history is not on file. Social History:  reports that she has never smoked. She has never used smokeless tobacco. She reports that she does not use drugs. Her alcohol history is not on file.     Maternal Diabetes: No Genetic Screening:  Maternal Ultrasounds/Referrals:  Fetal Ultrasounds or other Referrals:   Maternal Substance Abuse:   Significant Maternal Medications:   Significant Maternal Lab Results:   Other Comments:    Review of Systems  Constitutional: Negative.   HENT: Negative.   Eyes: Negative.   Respiratory: Negative.   Cardiovascular: Negative.   Gastrointestinal: Positive for abdominal pain.  Genitourinary: Negative.   Musculoskeletal: Negative.   Skin: Negative.   Neurological: Negative.   Psychiatric/Behavioral: Negative.    Maternal Medical History:  Reason for admission: Contractions.   Fetal activity: Perceived fetal activity is normal.    Prenatal Complications - Diabetes: none.    Dilation: 6 Effacement (%): 90 Station: 0 Exam by:: Lorn Junes. Goodman, RN Blood pressure 116/69, pulse 86, temperature 98.8 F (37.1 C), temperature source Oral, resp. rate 16, SpO2 99 %. Maternal Exam:  Abdomen: Patient reports generalized tenderness.  Fundal height is appropriate for gestation.   Fetal presentation: vertex  Introitus: Normal vulva. Normal vagina.    Physical Exam  Constitutional: She is oriented to person, place, and time. She  appears well-developed and well-nourished.  HENT:  Head: Normocephalic and atraumatic.  Cardiovascular: Normal rate and regular rhythm.   Respiratory: Effort normal and breath sounds normal. No respiratory distress. She has no wheezes.  GI: Soft. Bowel sounds are normal. She exhibits no distension. There is generalized tenderness.  Musculoskeletal: Normal range of motion.  Neurological: She is alert and oriented to person, place, and time.  Skin: Skin is warm and dry.  Psychiatric: She has a normal mood and affect. Her behavior is normal.    Prenatal labs: ABO, Rh: --/--/O POS, O POS (10/17 0230) Antibody: NEG (10/17 0230) Rubella: Immune (03/23 0000) RPR: Nonreactive (03/23 0000)  HBsAg: Negative (03/23 0000)  HIV: Non-reactive (03/23 0000)  GBS: Negative (09/19 0000)   Assessment/Plan: 26yo G1P0 at 39+ admitted with cervical change for augmentation of labor Epidural prn Augment with AROM/pitocin gbbs neg Expect SVD   Sonia Terry 12/20/2016, 6:54 AM

## 2016-12-20 NOTE — Progress Notes (Signed)
Patient ID: Sonia SohoMegan Gilkison, female   DOB: 05-Apr-1990, 26 y.o.   MRN: [id number] Pt feeling no pressure s/p epidural redose  afeb vss Category 1 tracing  cervix 10/c/+1-+2   Tried to push with reasonable effort, will allow to labor a little lower for 30-40 minutes  and then begin pushing

## 2016-12-20 NOTE — MAU Note (Signed)
Contractions since 2100, positive FM

## 2016-12-20 NOTE — Anesthesia Pain Management Evaluation Note (Signed)
  CRNA Pain Management Visit Note  Patient: Sonia Terry, 26 y.o., female  "Hello I am a member of the anesthesia team at [facility]. We have an anesthesia team available at all times to provide care throughout the hospital, including epidural management and anesthesia for C-section. I don't know your plan for the delivery whether it a natural birth, water birth, IV sedation, nitrous supplementation, doula or epidural, but we want to meet your pain goals."   1.Was your pain managed to your expectations on prior hospitalizations?   No prior hospitalizations  2.What is your expectation for pain management during this hospitalization?     Epidural  3.How can we help you reach that goal? Epidural in place and working well  Record the patient's initial score and the patient's pain goal.   Pain: 0  Pain Goal: 5 [facility] wants you to be able to say your pain was always managed very well.  Herny Scurlock 12/20/2016

## 2016-12-20 NOTE — Progress Notes (Signed)
Shift change report at Pts bedside when I noticed mom getting tearful. Support and encouragement given as well as a nap recommended.Mother states '' I'm so exhausted" Family at bedside as well and were very supportive.

## 2016-12-21 LAB — CBC
HEMATOCRIT: 27.2 % — AB (ref 36.0–46.0)
Hemoglobin: 9.4 g/dL — ABNORMAL LOW (ref 12.0–15.0)
MCH: 31.3 pg (ref 26.0–34.0)
MCHC: 34.6 g/dL (ref 30.0–36.0)
MCV: 90.7 fL (ref 78.0–100.0)
PLATELETS: 139 10*3/uL — AB (ref 150–400)
RBC: 3 MIL/uL — ABNORMAL LOW (ref 3.87–5.11)
RDW: 14.5 % (ref 11.5–15.5)
WBC: 14.3 10*3/uL — AB (ref 4.0–10.5)

## 2016-12-21 MED ORDER — PSEUDOEPHEDRINE HCL ER 120 MG PO TB12
120.0000 mg | ORAL_TABLET | Freq: Every day | ORAL | Status: DC | PRN
  Filled 2016-12-21: qty 1

## 2016-12-21 MED ORDER — GUAIFENESIN ER 600 MG PO TB12
600.0000 mg | ORAL_TABLET | Freq: Two times a day (BID) | ORAL | Status: DC | PRN
  Filled 2016-12-21: qty 1

## 2016-12-21 NOTE — Progress Notes (Signed)
PPD #1 No problems, a bit anxious, baby hasn't voided yet Afeb, VSS Fundus firm, NT at U-1 Continue routine postpartum care, will see if can do circ tomorrow

## 2016-12-21 NOTE — Anesthesia Postprocedure Evaluation (Signed)
Anesthesia Post Note  Patient: Sonia Terry  Procedure(s) Performed: AN AD HOC LABOR EPIDURAL     Patient location during evaluation: Mother Baby Anesthesia Type: Epidural Level of consciousness: awake and alert Pain management: pain level controlled Vital Signs Assessment: post-procedure vital signs reviewed and stable Respiratory status: spontaneous breathing Cardiovascular status: stable Postop Assessment: no headache, no backache, epidural receding, patient able to bend at knees, no apparent nausea or vomiting and adequate PO intake Anesthetic complications: no    Last Vitals:  Vitals:   12/20/16 2251 12/21/16 0542  BP: 111/67 105/60  Pulse: 84 70  Resp: 18 16  Temp:  36.7 C  SpO2:      Last Pain:  Vitals:   12/21/16 0544  TempSrc:   PainSc: 0-No pain   Pain Goal:                 [name], [name]

## 2016-12-21 NOTE — Lactation Note (Signed)
This note was copied from a baby's chart. Lactation Consultation Note New mom has flat nipple to Rt. Breast, compresses inwards slightly. Noted edema to areola. Reverse pressure helpful. Rt. Breast feels heavier than Lt. Mom stated they hand expressed Lt. Breast. Rt. Breast cup size small than Lt. Breast. Lt. Nipple short shaft elongated nipple. Shells given, strongly encouraged to wear.   Hand expression taught w/much expression before thick glue like colostrum to nipple. Spooned and touched gums w/spoon.  Baby gagging frequently. Has no interest in BF at this time. Abd. Slightly distended.  Encouraged STS. Newborn education on STS, I&O, cluster feeding, supply and demand. Mom encouraged to feed baby 8-12 times/24 hours and with feeding cues. Encouraged to stimulate baby to BF every 3 hours if hasn't cued. Alert RN if not BF by lunch.  Answered a lot of questions parents had.  WH/LC brochure given w/resources, support groups and LC services.  Patient Name: Sonia Terry [id number]'XToday's Date: 12/21/2016 Reason for consult: Initial assessment   Maternal Data Has patient been taught Hand Expression?: Yes Does the patient have breastfeeding experience prior to this delivery?: No  Feeding    LATCH Score Latch: Too sleepy or reluctant, no latch achieved, no sucking elicited.     Type of Nipple: Flat  Comfort (Breast/Nipple): Soft / non-tender        Interventions Interventions: Breast feeding basics reviewed;Breast compression;Hand pump;Support pillows;Breast massage;Position options;Hand express;Expressed milk;Reverse pressure;Shells  Lactation Tools Discussed/Used Tools: Shells;Pump Shell Type: Inverted Breast pump type: Manual Pump Review: Setup, frequency, and cleaning;Milk Storage Initiated by:: Peri JeffersonL. Kody Vigil RN IBCLC Date initiated:: 12/21/16   Consult Status Consult Status: Follow-up Date: 12/21/16 Follow-up type: In-patient    Sonia DancerCARVER, Sonia Terry 12/21/2016, 7:54  AM

## 2016-12-22 MED ORDER — IBUPROFEN 600 MG PO TABS: 600.0000 mg | ORAL_TABLET | Freq: Four times a day (QID) | ORAL | 1 refills | Status: DC | PRN

## 2016-12-22 NOTE — Lactation Note (Addendum)
This note was copied from a baby's chart. Lactation Consultation Note Mom states baby has more interest in BF now. Mom states baby prefers Lt. Breast, will not feed longer than 2-3 minutes on Rt. The Rt, nipple semi flat, wearing shells, helpful nipple short shaft. [facility]our[facility] to see if needs NS for the Rt. Breast. Lt. Breast larger than Rt. Breast. Nipple intact. Mom has personal nipple balm on nipples. Mom denies painful latching. Encouraged mom to assess fullness of breast before latching. At 37 hrs baby has had 2 voids and 6 stools and 3 emesis.   Patient Name: Boy Sonia Terry QMVHQ'IToday's Date: 12/22/2016 Reason for consult: Follow-up assessment   Maternal Data    Feeding    LATCH Score       Type of Nipple: Everted at rest and after stimulation  Comfort (Breast/Nipple): Filling, red/small blisters or bruises, mild/mod discomfort        Interventions Interventions: Breast compression;Shells  Lactation Tools Discussed/Used Tools: Shells   Consult Status Consult Status: Follow-up Date: 12/22/16 Follow-up type: In-patient    [name], [name] 12/22/2016, 5:12 AM

## 2016-12-22 NOTE — Progress Notes (Signed)
Patient ID: Sonia Terry, female   DOB: 1990-04-29, 26 y.o.   MRN: [id number] Pt doing well. Pain controlled. Lochia mild. No fever, chills, SOB or CP. Bonding well with baby VSS ABD - soft, ND, FF EXT - +1 edema b/l  A/P: PPD#2 s/p svd - stable         Reviewed discharge instructions         F/u in 6weeks in office

## 2016-12-22 NOTE — Lactation Note (Signed)
This note was copied from a baby's chart. Lactation Consultation Note  Patient Name: Sonia Terry ZOXWR'UToday's Date: 12/22/2016 Reason for consult: Follow-up assessment;NICU baby  Baby 45 hours old. Mom reports that she had been having some difficulty latching baby to right breast. However, she has been wearing a shell, and was just able to latch and nurse baby to right breast for 25 minutes. Mom reports that she feels comfortable with latch now. Discussed when to start pumping, engorgement prevention/treatment and mom aware of OP/BFSG and LC phone line assistance after D/C.   Maternal Data    Feeding Feeding Type: Breast Fed Length of feed: 25 min  LATCH Score                   Interventions    Lactation Tools Discussed/Used     Consult Status Consult Status: Complete    Sherlyn HayJennifer D Javi Bollman 12/22/2016, 12:39 PM

## 2016-12-22 NOTE — Discharge Instructions (Signed)
Nothing in vagina for 6 weeks.  No sex, tampons, and douching.  Other instructions as in [facility] Discharge Booklet. °

## 2016-12-22 NOTE — Discharge Summary (Signed)
OB Discharge Summary     Patient Name: Sonia Terry DOB: Nov 12, 1990 MRN: [id number]  Date of admission: 12/20/2016 Delivering MD: Huel CoteICHARDSON, KATHY   Date of discharge: 12/22/2016  Admitting diagnosis: 40 WEEKS CTX Intrauterine pregnancy: 2624w5d     Secondary diagnosis:  Active Problems:   Labor and delivery, indication for care   NSVD (normal spontaneous vaginal delivery)  Additional problems: none     Discharge diagnosis: Term Pregnancy Delivered                                                                                                Post partum procedures:none  Augmentation: AROM and Pitocin  Complications: None  Hospital course:  Onset of Labor With Vaginal Delivery     26 y.o. yo G1P1001 at 4624w5d was admitted in Latent Labor on 12/20/2016. Patient had an uncomplicated labor course as follows:  Membrane Rupture Time/Date: 8:30 AM ,12/20/2016   Intrapartum Procedures: Episiotomy: None [1]                                         Lacerations:  1st degree [2];Periurethral [8]  Patient had a delivery of a Viable infant. 12/20/2016  Information for the patient's newborn:  Sonia Highrotzek, Boy Sonia Terry [[id number]][[id number]]  Delivery Method: Vag-Spont    Pateint had an uncomplicated postpartum course.  She is ambulating, tolerating a regular diet, passing flatus, and urinating well. Patient is discharged home in stable condition on 12/22/16.   Physical exam  Vitals:   12/20/16 2251 12/21/16 0542 12/21/16 1815 12/22/16 0532  BP: 111/67 105/60 126/62 103/68  Pulse: 84 70 80 65  Resp: 18 16 18 18   Temp:  98 F (36.7 C) 98 F (36.7 C) 97.7 F (36.5 C)  TempSrc:  Oral Oral Oral  SpO2:       General: alert, cooperative and no distress Lochia: appropriate Uterine Fundus: firm Incision: N/A DVT Evaluation: No evidence of DVT seen on physical exam. Labs: Lab Results  Component Value Date   WBC 14.3 (H) 12/21/2016   HGB 9.4 (L) 12/21/2016   HCT 27.2 (L) 12/21/2016   MCV 90.7  12/21/2016   PLT 139 (L) 12/21/2016   No flowsheet data found.  Discharge instruction: per After Visit Summary and "Baby and Me Booklet".  After visit meds:  Allergies as of 12/22/2016   No Known Allergies     Medication List    TAKE these medications   docusate sodium 100 MG capsule Commonly known as:  COLACE Take 100 mg by mouth daily as needed for mild constipation.   ibuprofen 600 MG tablet Commonly known as:  ADVIL,MOTRIN Take 1 tablet (600 mg total) by mouth every 6 (six) hours as needed.   multivitamin with minerals Tabs tablet Take 1 tablet by mouth daily.       Diet: routine diet  Activity: Advance as tolerated. Pelvic rest for 6 weeks.   Outpatient follow up:6 weeks Follow up Appt:No future appointments. Follow up Visit:No Follow-up on file.  Postpartum contraception: Not Discussed  Newborn Data: Live born female  Birth Weight: 8 lb 2.9 oz (3710 g) APGAR: 9, 9  Newborn Delivery   Birth date/time:  12/20/2016 15:19:00 Delivery type:  Vaginal, Spontaneous Delivery      Baby Feeding: Breast Disposition:home with mother   12/22/2016 Cathrine Muster, DO

## 2017-01-05 ENCOUNTER — Telehealth (HOSPITAL_COMMUNITY): Payer: Self-pay | Admitting: Lactation Services

## 2017-01-05 NOTE — Telephone Encounter (Signed)
Return LC telephone call - mom called to request a post frenotomy LC O/P appt.  Baby had tx on 10/30 still having challenges with latching. LC reassured  Mom that is not un usual for it to take some time post tx.  Per mom baby does feed from a bottle , and she is pumping consistently.  Has a high desire to make this work.  LC asked mom to call the OB/ GYN clinic and set up the appt today for next Monday or  Tuesday, and gave her the phone number and directions of the clinic.

## 2017-03-15 ENCOUNTER — Encounter: Payer: Self-pay | Admitting: Family Medicine

## 2017-06-04 LAB — HM PAP SMEAR: HM Pap smear: NEGATIVE

## 2018-03-06 NOTE — L&D Delivery Note (Signed)
Delivery Note Pt crowning and delivered quickly upon arrival.  At 1:21 PM a viable and healthy female was delivered via Vaginal, Spontaneous (Presentation: OA; LOT ).  APGAR: 9, 9; weight P .   Placenta status: delivered, intact.  Cord: 3V with the following complications: none.   Anesthesia:  epidural Episiotomy: None Lacerations: 1st degree;Labial Suture Repair: 3.0 vicryl rapide Est. Blood Loss (mL): 121cc  Mom to postpartum.  Baby to Couplet care / Skin to Skin.  Carols Clemence Bovard-Stuckert 10/09/2018, 1:40 PM  Br/O+/RI/Tdap ?Glynn Octave?

## 2018-03-29 LAB — OB RESULTS CONSOLE HIV ANTIBODY (ROUTINE TESTING): HIV: NONREACTIVE

## 2018-03-29 LAB — OB RESULTS CONSOLE RUBELLA ANTIBODY, IGM: Rubella: IMMUNE

## 2018-03-29 LAB — OB RESULTS CONSOLE ANTIBODY SCREEN: Antibody Screen: NEGATIVE

## 2018-03-29 LAB — OB RESULTS CONSOLE HEPATITIS B SURFACE ANTIGEN: Hepatitis B Surface Ag: NEGATIVE

## 2018-03-29 LAB — OB RESULTS CONSOLE GC/CHLAMYDIA
Chlamydia: NEGATIVE
Gonorrhea: NEGATIVE

## 2018-03-29 LAB — OB RESULTS CONSOLE ABO/RH: RH Type: POSITIVE

## 2018-03-29 LAB — OB RESULTS CONSOLE RPR: RPR: NONREACTIVE

## 2018-05-03 ENCOUNTER — Encounter: Payer: Self-pay | Admitting: Family Medicine

## 2018-09-19 LAB — OB RESULTS CONSOLE GBS: GBS: NEGATIVE

## 2018-10-02 ENCOUNTER — Telehealth (HOSPITAL_COMMUNITY): Payer: Self-pay | Admitting: *Deleted

## 2018-10-02 ENCOUNTER — Encounter (HOSPITAL_COMMUNITY): Payer: Self-pay | Admitting: *Deleted

## 2018-10-02 NOTE — Telephone Encounter (Signed)
Preadmission screen  

## 2018-10-04 ENCOUNTER — Telehealth (HOSPITAL_COMMUNITY): Payer: Self-pay | Admitting: *Deleted

## 2018-10-04 ENCOUNTER — Encounter (HOSPITAL_COMMUNITY): Payer: Self-pay | Admitting: *Deleted

## 2018-10-04 NOTE — Telephone Encounter (Signed)
Preadmission screen  

## 2018-10-09 ENCOUNTER — Inpatient Hospital Stay (HOSPITAL_COMMUNITY): Payer: Managed Care, Other (non HMO) | Admitting: Anesthesiology

## 2018-10-09 ENCOUNTER — Other Ambulatory Visit: Payer: Self-pay

## 2018-10-09 ENCOUNTER — Encounter (HOSPITAL_COMMUNITY): Payer: Self-pay | Admitting: *Deleted

## 2018-10-09 ENCOUNTER — Inpatient Hospital Stay (HOSPITAL_COMMUNITY)
Admission: AD | Admit: 2018-10-09 | Discharge: 2018-10-10 | DRG: 807 | Disposition: A | Discharge status: Home / Self Care | Payer: Managed Care, Other (non HMO) | Attending: Obstetrics and Gynecology | Admitting: Obstetrics and Gynecology

## 2018-10-09 DIAGNOSIS — Z3A39 39 weeks gestation of pregnancy: Secondary | ICD-10-CM | POA: Diagnosis not present

## 2018-10-09 DIAGNOSIS — Z20828 Contact with and (suspected) exposure to other viral communicable diseases: Secondary | ICD-10-CM | POA: Diagnosis present

## 2018-10-09 DIAGNOSIS — O26893 Other specified pregnancy related conditions, third trimester: Secondary | ICD-10-CM | POA: Diagnosis present

## 2018-10-09 LAB — SARS CORONAVIRUS 2 BY RT PCR (HOSPITAL ORDER, PERFORMED IN ~~LOC~~ HOSPITAL LAB): SARS Coronavirus 2: NEGATIVE

## 2018-10-09 LAB — CBC
HCT: 35.2 % — ABNORMAL LOW (ref 36.0–46.0)
Hemoglobin: 12 g/dL (ref 12.0–15.0)
MCH: 32 pg (ref 26.0–34.0)
MCHC: 34.1 g/dL (ref 30.0–36.0)
MCV: 93.9 fL (ref 80.0–100.0)
Platelets: 157 10*3/uL (ref 150–400)
RBC: 3.75 MIL/uL — ABNORMAL LOW (ref 3.87–5.11)
RDW: 13.8 % (ref 11.5–15.5)
WBC: 9.8 10*3/uL (ref 4.0–10.5)
nRBC: 0 % (ref 0.0–0.2)

## 2018-10-09 LAB — ABO/RH: ABO/RH(D): O POS

## 2018-10-09 LAB — RPR: RPR Ser Ql: NONREACTIVE

## 2018-10-09 LAB — TYPE AND SCREEN
ABO/RH(D): O POS
Antibody Screen: NEGATIVE

## 2018-10-09 MED ORDER — ACETAMINOPHEN 325 MG PO TABS
650.0000 mg | ORAL_TABLET | ORAL | Status: DC | PRN
  Administered 2018-10-10: 650 mg via ORAL
  Filled 2018-10-09: qty 2

## 2018-10-09 MED ORDER — OXYCODONE HCL 5 MG PO TABS: 10.0000 mg | ORAL_TABLET | ORAL | Status: DC | PRN

## 2018-10-09 MED ORDER — LACTATED RINGERS IV SOLN: 500.0000 mL | Freq: Once | INTRAVENOUS | Status: DC

## 2018-10-09 MED ORDER — WITCH HAZEL-GLYCERIN EX PADS: 1.0000 "application " | MEDICATED_PAD | CUTANEOUS | Status: DC | PRN

## 2018-10-09 MED ORDER — OXYTOCIN BOLUS FROM INFUSION
500.0000 mL | Freq: Once | INTRAVENOUS | Status: AC
  Administered 2018-10-09: 13:00:00 500 mL via INTRAVENOUS

## 2018-10-09 MED ORDER — SIMETHICONE 80 MG PO CHEW: 80.0000 mg | CHEWABLE_TABLET | ORAL | Status: DC | PRN

## 2018-10-09 MED ORDER — FENTANYL CITRATE (PF) 100 MCG/2ML IJ SOLN: 50.0000 ug | INTRAMUSCULAR | Status: DC | PRN

## 2018-10-09 MED ORDER — COCONUT OIL OIL: 1.0000 "application " | TOPICAL_OIL | Status: DC | PRN

## 2018-10-09 MED ORDER — DIPHENHYDRAMINE HCL 25 MG PO CAPS: 25.0000 mg | ORAL_CAPSULE | Freq: Four times a day (QID) | ORAL | Status: DC | PRN

## 2018-10-09 MED ORDER — ONDANSETRON HCL 4 MG/2ML IJ SOLN: 4.0000 mg | INTRAMUSCULAR | Status: DC | PRN

## 2018-10-09 MED ORDER — ONDANSETRON HCL 4 MG PO TABS: 4.0000 mg | ORAL_TABLET | ORAL | Status: DC | PRN

## 2018-10-09 MED ORDER — DIBUCAINE (PERIANAL) 1 % EX OINT: 1.0000 "application " | TOPICAL_OINTMENT | CUTANEOUS | Status: DC | PRN

## 2018-10-09 MED ORDER — TERBUTALINE SULFATE 1 MG/ML IJ SOLN: 0.2500 mg | Freq: Once | INTRAMUSCULAR | Status: DC | PRN

## 2018-10-09 MED ORDER — ONDANSETRON HCL 4 MG/2ML IJ SOLN: 4.0000 mg | Freq: Four times a day (QID) | INTRAMUSCULAR | Status: DC | PRN

## 2018-10-09 MED ORDER — LIDOCAINE HCL (PF) 1 % IJ SOLN
INTRAMUSCULAR | Status: DC | PRN
  Administered 2018-10-09 (×2): 7 mL via EPIDURAL

## 2018-10-09 MED ORDER — SENNOSIDES-DOCUSATE SODIUM 8.6-50 MG PO TABS
2.0000 | ORAL_TABLET | ORAL | Status: DC
  Administered 2018-10-10: 2 via ORAL
  Filled 2018-10-09: qty 2

## 2018-10-09 MED ORDER — PRENATAL MULTIVITAMIN CH
1.0000 | ORAL_TABLET | Freq: Every day | ORAL | Status: DC
  Administered 2018-10-10: 1 via ORAL
  Filled 2018-10-09: qty 1

## 2018-10-09 MED ORDER — ACETAMINOPHEN 325 MG PO TABS: 650.0000 mg | ORAL_TABLET | ORAL | Status: DC | PRN

## 2018-10-09 MED ORDER — PHENYLEPHRINE 40 MCG/ML (10ML) SYRINGE FOR IV PUSH (FOR BLOOD PRESSURE SUPPORT): 80.0000 ug | PREFILLED_SYRINGE | INTRAVENOUS | Status: DC | PRN

## 2018-10-09 MED ORDER — EPHEDRINE 5 MG/ML INJ: 10.0000 mg | INTRAVENOUS | Status: DC | PRN

## 2018-10-09 MED ORDER — SOD CITRATE-CITRIC ACID 500-334 MG/5ML PO SOLN: 30.0000 mL | ORAL | Status: DC | PRN

## 2018-10-09 MED ORDER — LIDOCAINE HCL (PF) 1 % IJ SOLN: 30.0000 mL | INTRAMUSCULAR | Status: DC | PRN

## 2018-10-09 MED ORDER — PHENYLEPHRINE 40 MCG/ML (10ML) SYRINGE FOR IV PUSH (FOR BLOOD PRESSURE SUPPORT)
80.0000 ug | PREFILLED_SYRINGE | INTRAVENOUS | Status: DC | PRN
  Filled 2018-10-09: qty 10

## 2018-10-09 MED ORDER — BENZOCAINE-MENTHOL 20-0.5 % EX AERO
1.0000 "application " | INHALATION_SPRAY | CUTANEOUS | Status: DC | PRN
  Administered 2018-10-09: 1 via TOPICAL
  Filled 2018-10-09: qty 56

## 2018-10-09 MED ORDER — LACTATED RINGERS IV SOLN: INTRAVENOUS | Status: DC

## 2018-10-09 MED ORDER — OXYCODONE-ACETAMINOPHEN 5-325 MG PO TABS: 2.0000 | ORAL_TABLET | ORAL | Status: DC | PRN

## 2018-10-09 MED ORDER — OXYCODONE HCL 5 MG PO TABS
5.0000 mg | ORAL_TABLET | ORAL | Status: DC | PRN
  Filled 2018-10-09: qty 1

## 2018-10-09 MED ORDER — LACTATED RINGERS IV SOLN: 500.0000 mL | INTRAVENOUS | Status: DC | PRN

## 2018-10-09 MED ORDER — FENTANYL-BUPIVACAINE-NACL 0.5-0.125-0.9 MG/250ML-% EP SOLN
12.0000 mL/h | EPIDURAL | Status: DC | PRN
  Filled 2018-10-09: qty 250

## 2018-10-09 MED ORDER — OXYTOCIN 40 UNITS IN NORMAL SALINE INFUSION - SIMPLE MED
1.0000 m[IU]/min | INTRAVENOUS | Status: DC
  Administered 2018-10-09: 10:00:00 2 m[IU]/min via INTRAVENOUS
  Filled 2018-10-09: qty 1000

## 2018-10-09 MED ORDER — ZOLPIDEM TARTRATE 5 MG PO TABS: 5.0000 mg | ORAL_TABLET | Freq: Every evening | ORAL | Status: DC | PRN

## 2018-10-09 MED ORDER — TETANUS-DIPHTH-ACELL PERTUSSIS 5-2.5-18.5 LF-MCG/0.5 IM SUSP: 0.5000 mL | Freq: Once | INTRAMUSCULAR | Status: DC

## 2018-10-09 MED ORDER — SODIUM CHLORIDE (PF) 0.9 % IJ SOLN
INTRAMUSCULAR | Status: DC | PRN
  Administered 2018-10-09: 12 mL/h via EPIDURAL

## 2018-10-09 MED ORDER — OXYCODONE-ACETAMINOPHEN 5-325 MG PO TABS: 1.0000 | ORAL_TABLET | ORAL | Status: DC | PRN

## 2018-10-09 MED ORDER — LACTATED RINGERS IV SOLN
INTRAVENOUS | Status: DC
  Administered 2018-10-09: 07:00:00 via INTRAVENOUS

## 2018-10-09 MED ORDER — OXYTOCIN 40 UNITS IN NORMAL SALINE INFUSION - SIMPLE MED: 2.5000 [IU]/h | INTRAVENOUS | Status: DC

## 2018-10-09 MED ORDER — DIPHENHYDRAMINE HCL 50 MG/ML IJ SOLN: 12.5000 mg | INTRAMUSCULAR | Status: DC | PRN

## 2018-10-09 MED ORDER — IBUPROFEN 600 MG PO TABS
600.0000 mg | ORAL_TABLET | Freq: Four times a day (QID) | ORAL | Status: DC
  Administered 2018-10-09 – 2018-10-10 (×4): 600 mg via ORAL
  Filled 2018-10-09 (×4): qty 1

## 2018-10-09 NOTE — Anesthesia Preprocedure Evaluation (Signed)
Anesthesia Evaluation  Patient identified by MRN, date of birth, ID band Patient awake    Reviewed: Allergy & Precautions, H&P , NPO status , Patient's Chart, lab work & pertinent test results  Airway Mallampati: I  TM Distance: >3 FB Neck ROM: full    Dental no notable dental hx. (+) Teeth Intact   Pulmonary neg pulmonary ROS,    Pulmonary exam normal breath sounds clear to auscultation       Cardiovascular negative cardio ROS Normal cardiovascular exam Rhythm:regular Rate:Normal     Neuro/Psych negative neurological ROS  negative psych ROS   GI/Hepatic negative GI ROS, Neg liver ROS,   Endo/Other  negative endocrine ROS  Renal/GU negative Renal ROS  negative genitourinary   Musculoskeletal   Abdominal Normal abdominal exam  (+)   Peds  Hematology negative hematology ROS (+)   Anesthesia Other Findings   Reproductive/Obstetrics (+) Pregnancy                             Anesthesia Physical Anesthesia Plan  ASA: II  Anesthesia Plan: Epidural   Post-op Pain Management:    Induction:   PONV Risk Score and Plan:   Airway Management Planned:   Additional Equipment:   Intra-op Plan:   Post-operative Plan:   Informed Consent: I have reviewed the patients History and Physical, chart, labs and discussed the procedure including the risks, benefits and alternatives for the proposed anesthesia with the patient or authorized representative who has indicated his/her understanding and acceptance.       Plan Discussed with:   Anesthesia Plan Comments:         Anesthesia Quick Evaluation

## 2018-10-09 NOTE — Progress Notes (Signed)
Patient ID: Sonia Terry, female   DOB: 28-Jan-1991, 28 y.o.   MRN: [id number]   Comfortable with epidural  AFVSS gen NAD FHTs 140-150's, mod var, + accels, category 1 toco Q 52min  SVE 5/80/0  IUPC placed w/o diff/comp.    Continue IOL

## 2018-10-09 NOTE — MAU Note (Signed)
Pt reports to MAU c/o ctx every 35min or less. +FM. No LOF. Pt reports some bleeding when she wiped one time.

## 2018-10-09 NOTE — Anesthesia Procedure Notes (Signed)
Epidural Patient location during procedure: OB Start time: 10/09/2018 8:32 AM End time: 10/09/2018 8:35 AM  Staffing Anesthesiologist: Lyn Hollingshead, MD Performed: anesthesiologist   Preanesthetic Checklist Completed: patient identified, site marked, surgical consent, pre-op evaluation, timeout performed, IV checked, risks and benefits discussed and monitors and equipment checked  Epidural Patient position: sitting Prep: site prepped and draped and DuraPrep Patient monitoring: continuous pulse ox and blood pressure Approach: midline Location: L3-L4 Injection technique: LOR air  Needle:  Needle type: Tuohy  Needle gauge: 17 G Needle length: 9 cm and 9 Needle insertion depth: 6 cm Catheter type: closed end flexible Catheter size: 19 Gauge Catheter at skin depth: 11 cm Test dose: negative and Other  Assessment Events: blood not aspirated, injection not painful, no injection resistance, negative IV test and paresthesia  Additional Notes L leg X 1Reason for block:procedure for pain

## 2018-10-09 NOTE — Progress Notes (Signed)
Patient ID: Sonia Terry, female   DOB: [birth year], 28 y.o.   MRN: [id number]   Dr Marvel Plan broke water w/o comp Getting epidural  AFVSS gen NAD FHTs 140-150's mod var, + accels, category 1 toco irr ctx  Will start pitocin after epidural placement

## 2018-10-09 NOTE — H&P (Signed)
Bethann Brule is a 28 y.o. female G2P1001 at 38 3/7 weeks (EDD 10/13/18 by LMP c/w 10 week [place]) presenting for regular contractions every 5 minutes) Prenatal care uneventful.  OB History    Gravida  2   Para  1   Term  1   Preterm  0   AB  0   Living  1     SAB  0   TAB  0   Ectopic  0   Multiple      Live Births  1         NSVD 2018 8#2oz  Past Medical History:  Diagnosis Date  . Medical history non-contributory   . Vaginal Pap smear, abnormal    Past Surgical History:  Procedure Laterality Date  . TONSILLECTOMY  2011  . TONSILLECTOMY     Family History: family history includes Alcoholism in her maternal grandfather; Hyperlipidemia in her maternal grandfather; Hypertension in her maternal grandmother. Social History:  reports that she has never smoked. She has never used smokeless tobacco. She reports previous alcohol use. She reports that she does not use drugs.     Maternal Diabetes: No Genetic Screening: Declined Maternal Ultrasounds/Referrals: Normal Fetal Ultrasounds or other Referrals:  None Maternal Substance Abuse:  No Significant Maternal Medications:  None Significant Maternal Lab Results:  None Other Comments:  None  Review of Systems  Constitutional: Positive for fever.  Eyes: Negative for blurred vision.  Gastrointestinal: Positive for abdominal pain.   Maternal Medical History:  Reason for admission: Contractions.   Contractions: Onset was 1-2 hours ago.   Frequency: regular.   Perceived severity is moderate.    Fetal activity: Perceived fetal activity is normal.    Prenatal complications: no prenatal complications Prenatal Complications - Diabetes: none.    Dilation: 4 Effacement (%): 60 Station: -3 Exam by:: LAUREN COX RN Pulse 74, temperature 98.2 F (36.8 C), temperature source Oral, resp. rate 17, weight 82.8 kg, last menstrual period 01/06/2018, unknown if currently breastfeeding. Maternal Exam:  Uterine Assessment:  Contraction strength is moderate.  Contraction frequency is regular.   Abdomen: Patient reports no abdominal tenderness. Fetal presentation: vertex  Introitus: Normal vulva. Normal vagina.    Physical Exam  Constitutional: She appears well-developed.  Cardiovascular: Normal rate and regular rhythm.  Respiratory: Effort normal.  GI: Soft.  Genitourinary:    Vulva and vagina normal.   Neurological: She is alert.  Psychiatric: She has a normal mood and affect.    Prenatal labs: ABO, Rh: O/Positive/-- (01/24 0000) Antibody: Negative (01/24 0000) Rubella: Immune (01/24 0000) RPR: Nonreactive (01/24 0000)  HBsAg: Negative (01/24 0000)  HIV: Non-reactive (01/24 0000)  GBS: Negative (07/16 0000)  One hour GCT 127  Assessment/Plan: Pt presents with painful contractions and still in latent phase labor but lives 30 minutes from hospital and is 39+ weeks so will admit to L&D. Epidural as desires.   Understands will AROM and augment as needed  Logan Bores 10/09/2018, 4:43 AM

## 2018-10-09 NOTE — Progress Notes (Signed)
Patient ID: Sonia Terry, female   DOB: 1991-02-25, 28 y.o.   MRN: [id number] Pt feeling irregular contractions, some strong  afeb VSS FHR category 1  70/4+/-2   AROM clear  Will begin pitocin Epidural prn

## 2018-10-10 LAB — CBC
HCT: 32.7 % — ABNORMAL LOW (ref 36.0–46.0)
Hemoglobin: 11 g/dL — ABNORMAL LOW (ref 12.0–15.0)
MCH: 31.6 pg (ref 26.0–34.0)
MCHC: 33.6 g/dL (ref 30.0–36.0)
MCV: 94 fL (ref 80.0–100.0)
Platelets: 137 10*3/uL — ABNORMAL LOW (ref 150–400)
RBC: 3.48 MIL/uL — ABNORMAL LOW (ref 3.87–5.11)
RDW: 14 % (ref 11.5–15.5)
WBC: 12.1 10*3/uL — ABNORMAL HIGH (ref 4.0–10.5)
nRBC: 0 % (ref 0.0–0.2)

## 2018-10-10 MED ORDER — OXYCODONE HCL 5 MG PO TABS: 5.0000 mg | ORAL_TABLET | ORAL | 0 refills | Status: DC | PRN

## 2018-10-10 MED ORDER — IBUPROFEN 600 MG PO TABS: 600.0000 mg | ORAL_TABLET | Freq: Four times a day (QID) | ORAL | 0 refills | Status: AC
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
## 2018-10-10 NOTE — Lactation Note (Signed)
This note was copied from a baby's chart. Lactation Consultation Note  Patient Name: Sonia Terry [id number]'S Date: 10/10/2018   Infant was sleeping when I entered room, but Mom says she will call me to return. Mom reports that she found the nipple shield helpful last night, but didn't need it at the last feeding. Mom reports that she has heard some swallows, but has also heard some clicking.    [name] [facility] 10/10/2018, 12:33 PM

## 2018-10-10 NOTE — Lactation Note (Signed)
This note was copied from a baby's chart. Lactation Consultation Note  Patient Name: Sonia Terry [id number] Date: 10/10/2018   Mom was assisted with latch & infant alignment. Once latched, multiple swallows were audible to the naked ear. Dad was also shown how he could assist.  Parents know how to reach [place] after discharge.   [name] [facility] 10/10/2018, 3:54 PM

## 2018-10-10 NOTE — Lactation Note (Signed)
This note was copied from a baby's chart. Lactation Consultation Note  Patient Name: Sonia Terry AYTKZ'S Date: 10/10/2018   Infant is preparing to have her hearing screen, but Mom would like me to come back to do a latch check. Nurse tech will notify me when to return to room.  [name] [facility] 10/10/2018, 12:19 PM

## 2018-10-10 NOTE — Discharge Summary (Signed)
OB Discharge Summary     Patient Name: Sonia Terry DOB: 1990/09/22 MRN: [id number]  Date of admission: 10/09/2018 Delivering MD: Sherian ReinBOVARD-STUCKERT, JODY   Date of discharge: 10/10/2018  Admitting diagnosis: 39.3WKS CTX Intrauterine pregnancy: 6223w3d     Secondary diagnosis:  Principal Problem:   SVD (spontaneous vaginal delivery) Active Problems:   Indication for care in labor and delivery, antepartum      Discharge diagnosis: Term Pregnancy Delivered                                                                                                Hospital course:  Onset of Labor With Vaginal Delivery     28 y.o. yo [id number] at 1123w3d was admitted in Active Labor on 10/09/2018. Patient had an uncomplicated labor course as follows:  Membrane Rupture Time/Date: 7:26 AM ,10/09/2018   Intrapartum Procedures: Episiotomy: None [1]                                         Lacerations:  1st degree [2];Labial [10]  Patient had a delivery of a Viable infant. 10/09/2018  Information for the patient's newborn:  Ian Malkinrotzek, Girl Aundra MilletMegan [[id number]][[id number]]       Pateint had an uncomplicated postpartum course.  She is ambulating, tolerating a regular diet, passing flatus, and urinating well. Patient is discharged home in stable condition on 10/10/18.   Physical exam  Vitals:   10/09/18 1645 10/09/18 2045 10/10/18 0045 10/10/18 0445  BP: 118/75 100/72 107/71 103/69  Pulse: 73 76 70 71  Resp: 16 16 16 16   Temp: 99.9 F (37.7 C) 98.2 F (36.8 C) 98 F (36.7 C) 98.4 F (36.9 C)  TempSrc: Oral Oral Oral Oral  SpO2: 99% 98% 97% 98%  Weight:      Height:       General: alert Lochia: appropriate Uterine Fundus: firm  Labs: Lab Results  Component Value Date   WBC 12.1 (H) 10/10/2018   HGB 11.0 (L) 10/10/2018   HCT 32.7 (L) 10/10/2018   MCV 94.0 10/10/2018   PLT 137 (L) 10/10/2018   CMP Latest Ref Rng & Units 07/31/2013  Glucose 70 - 99 mg/dL 79  BUN 6 - 23 mg/dL 19  Creatinine 0.4 - 1.2 mg/dL 0.9   Sodium 742135 - 595145 mEq/L 139  Potassium 3.5 - 5.1 mEq/L 3.8  Chloride 96 - 112 mEq/L 109  CO2 19 - 32 mEq/L 24  Calcium 8.4 - 10.5 mg/dL 9.2  Total Protein 6.0 - 8.3 g/dL -  Total Bilirubin 0.3 - 1.2 mg/dL -  Alkaline Phos 39 - 638117 U/L -  AST 0 - 37 U/L -  ALT 0 - 35 U/L -    Discharge instruction: per After Visit Summary and "Baby and Me Booklet".  After visit meds:  Allergies as of 10/10/2018   No Known Allergies     Medication List    TAKE these medications   ferrous sulfate 325 (65 FE) MG tablet Take 325 mg by  mouth daily with breakfast.   ibuprofen 600 MG tablet Commonly known as: ADVIL Take 1 tablet (600 mg total) by mouth every 6 (six) hours.   oxyCODONE 5 MG immediate release tablet Commonly known as: Oxy IR/ROXICODONE Take 1 tablet (5 mg total) by mouth every 4 (four) hours as needed for severe pain.   prenatal multivitamin Tabs tablet Take 1 tablet by mouth daily at 12 noon.       Diet: routine diet  Activity: Advance as tolerated. Pelvic rest for 6 weeks.   Outpatient follow up:6 weeks  Newborn Data: Live born female  Birth Weight: 8 lb 7.1 oz (3830 g) APGAR: 51, 9  Newborn Delivery   Birth date/time: 10/09/2018 13:21:00 Delivery type: Vaginal, Spontaneous      Baby Feeding: Breast Disposition:home with mother   10/10/2018 Clarene Duke, MD

## 2018-10-10 NOTE — Discharge Instructions (Signed)
As per discharge pamphlet °

## 2018-10-10 NOTE — Anesthesia Postprocedure Evaluation (Signed)
Anesthesia Post Note  Patient: Sonia Terry  Procedure(s) Performed: AN AD HOC LABOR EPIDURAL     Patient location during evaluation: Mother Baby Anesthesia Type: Epidural Level of consciousness: awake and alert and oriented Pain management: satisfactory to patient Vital Signs Assessment: post-procedure vital signs reviewed and stable Respiratory status: spontaneous breathing and nonlabored ventilation Cardiovascular status: stable Postop Assessment: no headache, no backache, no signs of nausea or vomiting, adequate PO intake, patient able to bend at knees and able to ambulate (patient up walking) Anesthetic complications: no    Last Vitals:  Vitals:   10/10/18 0045 10/10/18 0445  BP: 107/71 103/69  Pulse: 70 71  Resp: 16 16  Temp: 36.7 C 36.9 C  SpO2: 97% 98%    Last Pain:  Vitals:   10/10/18 0445  TempSrc: Oral  PainSc: 0-No pain   Pain Goal: Patients Stated Pain Goal: 0 (10/09/18 0423)                 Willa Rough

## 2018-10-10 NOTE — Progress Notes (Signed)
PPD #1 No problems except cramping with nursing, wants to go home today Afeb, VSS Fundus firm, NT at U-2 Continue routine postpartum care, d/c home this pm

## 2018-10-12 ENCOUNTER — Other Ambulatory Visit (HOSPITAL_COMMUNITY)
Admission: RE | Admit: 2018-10-12 | Discharge: 2018-10-12 | Disposition: A | Discharge status: Home / Self Care | Payer: Managed Care, Other (non HMO) | Source: Ambulatory Visit | Attending: Family Medicine | Admitting: Family Medicine

## 2018-10-14 ENCOUNTER — Inpatient Hospital Stay (HOSPITAL_COMMUNITY): Payer: Managed Care, Other (non HMO)

## 2018-10-14 ENCOUNTER — Inpatient Hospital Stay (HOSPITAL_COMMUNITY): Admission: RE | Admit: 2018-10-14 | Payer: Managed Care, Other (non HMO) | Source: Home / Self Care

## 2018-10-15 ENCOUNTER — Encounter: Payer: Self-pay | Admitting: Family Medicine

## 2020-10-08 ENCOUNTER — Inpatient Hospital Stay (HOSPITAL_COMMUNITY)
Admission: AD | Admit: 2020-10-08 | Discharge: 2020-10-08 | Disposition: A | Discharge status: Home / Self Care | Payer: BC Managed Care – PPO | Attending: Obstetrics and Gynecology | Admitting: Obstetrics and Gynecology

## 2020-10-08 ENCOUNTER — Other Ambulatory Visit: Payer: Self-pay

## 2020-10-08 ENCOUNTER — Encounter (HOSPITAL_COMMUNITY): Payer: Self-pay | Admitting: Obstetrics and Gynecology

## 2020-10-08 ENCOUNTER — Inpatient Hospital Stay (HOSPITAL_COMMUNITY): Payer: BC Managed Care – PPO

## 2020-10-08 DIAGNOSIS — O039 Complete or unspecified spontaneous abortion without complication: Secondary | ICD-10-CM | POA: Insufficient documentation

## 2020-10-08 DIAGNOSIS — O209 Hemorrhage in early pregnancy, unspecified: Secondary | ICD-10-CM | POA: Diagnosis present

## 2020-10-08 DIAGNOSIS — U071 COVID-19: Secondary | ICD-10-CM | POA: Diagnosis not present

## 2020-10-08 DIAGNOSIS — O98511 Other viral diseases complicating pregnancy, first trimester: Secondary | ICD-10-CM | POA: Diagnosis not present

## 2020-10-08 DIAGNOSIS — Z3A01 Less than 8 weeks gestation of pregnancy: Secondary | ICD-10-CM | POA: Diagnosis not present

## 2020-10-08 HISTORY — DX: Anemia, unspecified: D64.9

## 2020-10-08 LAB — HCG, QUANTITATIVE, PREGNANCY: hCG, Beta Chain, Quant, S: 4649 m[IU]/mL — ABNORMAL HIGH (ref <5)

## 2020-10-08 LAB — CBC WITH DIFFERENTIAL/PLATELET
Abs Immature Granulocytes: 0.03 10*3/uL (ref 0.00–0.07)
Basophils Absolute: 0 10*3/uL (ref 0.0–0.1)
Basophils Relative: 0 %
Eosinophils Absolute: 0 10*3/uL (ref 0.0–0.5)
Eosinophils Relative: 1 %
HCT: 35.6 % — ABNORMAL LOW (ref 36.0–46.0)
Hemoglobin: 11.8 g/dL — ABNORMAL LOW (ref 12.0–15.0)
Immature Granulocytes: 1 %
Lymphocytes Relative: 18 %
Lymphs Abs: 0.9 10*3/uL (ref 0.7–4.0)
MCH: 30 pg (ref 26.0–34.0)
MCHC: 33.1 g/dL (ref 30.0–36.0)
MCV: 90.6 fL (ref 80.0–100.0)
Monocytes Absolute: 0.4 10*3/uL (ref 0.1–1.0)
Monocytes Relative: 7 %
Neutro Abs: 3.6 10*3/uL (ref 1.7–7.7)
Neutrophils Relative %: 73 %
Platelets: 123 10*3/uL — ABNORMAL LOW (ref 150–400)
RBC: 3.93 MIL/uL (ref 3.87–5.11)
RDW: 12.3 % (ref 11.5–15.5)
WBC: 4.9 10*3/uL (ref 4.0–10.5)
nRBC: 0 % (ref 0.0–0.2)

## 2020-10-08 LAB — WET PREP, GENITAL
Clue Cells Wet Prep HPF POC: NONE SEEN
Sperm: NONE SEEN
Trich, Wet Prep: NONE SEEN
Yeast Wet Prep HPF POC: NONE SEEN

## 2020-10-08 LAB — POCT PREGNANCY, URINE: Preg Test, Ur: POSITIVE — AB

## 2020-10-08 MED ORDER — MISOPROSTOL 200 MCG PO TABS: 800.0000 ug | ORAL_TABLET | Freq: Once | ORAL | 0 refills | Status: AC

## 2020-10-08 MED ORDER — HYDROCODONE-ACETAMINOPHEN 5-325 MG PO TABS: 1.0000 | ORAL_TABLET | Freq: Four times a day (QID) | ORAL | 0 refills | Status: AC | PRN

## 2020-10-08 NOTE — MAU Note (Signed)
Started spotting on Wed.  Became heavier and is now cramping.  Called office, has appt in office on Mon.  Tested + for Covid on 8/1, states is feeling better, highest temp was Monday night- 100.4.

## 2020-10-08 NOTE — Discharge Instructions (Addendum)
I have sent in medication (Cytotec) that can help with passage, you can continue to consider using this as your body is active. You can take tylenol 1000mg  up to 3-4 times daily in addition to ibuprofen 400-600mg  every 6 hours. If cramping is severe, you can use the stronger medication sent in.

## 2020-10-08 NOTE — MAU Provider Note (Signed)
History     CSN: [id number]  Arrival date and time: 10/08/20 1012   Chief Complaint  Patient presents with   Vaginal Bleeding   Possible Pregnancy   Sonia Terry is a 30 year old female G3, P2 at [redacted] weeks gestation via sure LMP on 08/20/2020 presenting today for vaginal bleeding.  She had her initial prenatal appointment scheduled for this upcoming Monday, 8/8, had it postponed after she developed COVID on 8/1.  In terms of COVID symptoms, she overall is feeling well.  Reports she started spotting on Wednesday, 8/3, that continue to increase in amount through this morning.  Stated the bleeding got heavier last night with some "pea-sized" clots. It is about the amount of menstrual cycle. She has not seen any products of conception that she is aware of.  Associated lower abdominal cramping similar to menstrual cramping.  Denies any lightheadedness/dizziness, SOB, CP.  She is married in a monogamous relationship, no concern for STDs.  OB History     Gravida  3   Para  2   Term  2   Preterm  0   AB  0   Living  2      SAB  0   IAB  0   Ectopic  0   Multiple  0   Live Births  2           Past Medical History:  Diagnosis Date   Anemia    Medical history non-contributory    SVD (spontaneous vaginal delivery) 10/09/2018   Vaginal Pap smear, abnormal    2 colpo; last pap was normal    Past Surgical History:  Procedure Laterality Date   TONSILLECTOMY  2011   TONSILLECTOMY      Family History  Problem Relation Age of Onset   Hypertension Mother    Hyperlipidemia Mother    Heart disease Father        family hx,  is on beta blockers   Hypertension Maternal Grandmother    Alcoholism Maternal Grandfather    Hyperlipidemia Maternal Grandfather     Social History   Tobacco Use   Smoking status: Never   Smokeless tobacco: Never  Vaping Use   Vaping Use: Never used  Substance Use Topics   Alcohol use: Not Currently    Comment: occ glass wine   Drug use: No     Allergies: No Known Allergies  Medications Prior to Admission  Medication Sig Dispense Refill Last Dose   acetaminophen (TYLENOL) 325 MG tablet Take 650 mg by mouth every 6 (six) hours as needed.      acetaminophen (TYLENOL) 650 MG CR tablet Take 650 mg by mouth every 8 (eight) hours as needed for pain.   10/07/2020 at 2000   Prenatal Vit-Fe Fumarate-FA (PRENATAL MULTIVITAMIN) TABS tablet Take 1 tablet by mouth daily at 12 noon.   10/07/2020   ferrous sulfate 325 (65 FE) MG tablet Take 325 mg by mouth daily with breakfast.      ibuprofen (ADVIL) 600 MG tablet Take 1 tablet (600 mg total) by mouth every 6 (six) hours. 30 tablet 0    oxyCODONE (OXY IR/ROXICODONE) 5 MG immediate release tablet Take 1 tablet (5 mg total) by mouth every 4 (four) hours as needed for severe pain. 10 tablet 0     Review of Systems  Constitutional:  Positive for fatigue and fever.       Last fever on Monday 8/1 with current COVID-positive status  Respiratory:  Negative for chest  tightness and shortness of breath.   Cardiovascular:  Negative for chest pain.  Gastrointestinal:  Positive for abdominal pain. Negative for constipation, diarrhea and vomiting.  Genitourinary:  Positive for vaginal bleeding.  Neurological:  Negative for dizziness and light-headedness.  Psychiatric/Behavioral:  Negative for confusion.   Physical Exam   Blood pressure 115/77, pulse 100, temperature 99.7 F (37.6 C), temperature source Oral, resp. rate 16, height 5\' 8"  (1.727 m), weight 69.4 kg, last menstrual period 08/20/2020, SpO2 100 %, unknown if currently breastfeeding.  Physical Exam Constitutional:      General: She is not in acute distress.    Appearance: Normal appearance. She is not ill-appearing.  HENT:     Head: Normocephalic and atraumatic.     Mouth/Throat:     Mouth: Mucous membranes are moist.  Eyes:     Extraocular Movements: Extraocular movements intact.  Cardiovascular:     Pulses: Normal pulses.  Pulmonary:      Effort: Pulmonary effort is normal.  Abdominal:     Palpations: Abdomen is soft.     Tenderness: There is no abdominal tenderness.  Genitourinary:    Comments: Pelvic exam: VULVA: normal appearing vulva with no masses, tenderness or lesions, VAGINA: normal appearing vagina with normal color and no lesions, CERVIX: normal appearing cervix without lesions, multiparous os. Moderate amount of vaginal bleeding noted, coming from external os. Chaperoned by RN.  Musculoskeletal:        General: Normal range of motion.  Skin:    General: Skin is warm and dry.  Neurological:     General: No focal deficit present.     Mental Status: She is alert.  Psychiatric:        Behavior: Behavior normal.     Comments: tearful    MAU Course   MDM Vaginal bleeding in first trimester work-up, no previous confirmed IUP. Rule out ectopic which can be life threatening.  CBC (hgb 11.8, around baseline)  Beta-hCG quant 4,649 GC/CH, wet prep pending  OB U/S O+ blood type  Assessment and Plan   30 year old female G3, P2 at [redacted] weeks gestation via LMP presenting for worsening vaginal bleeding over the past two days.  U/S showing gestational sac located within endocervical canal, overall findings suspicious for failed pregnancy/miscarriage in progress.  Discussed findings with her OB/GYN, Dr. 34, who will arrange appropriate follow-up in her office for serial beta-hCG and visited patient while in the MAU.  Rx'd Cytotec 800 mg (patient is undecided whether she will use this, sent to pharmacy in case) and short course of Norco for severe cramps.  May use alternating Tylenol/ibuprofen as needed.  Provided supportive listening and comfort.  Platelets incidentally noted to be 123, appears that patient has a history of mild gestational thrombocytopenia in previous pregnancies.  Asymptomatic.  Recommended follow-up of CBC with OB/GYN.  Return precautions discussed.  Patient was discharged home in stable  condition.  Mindi Slicker 10/08/2020, 11:11 AM

## 2020-10-10 LAB — GC/CHLAMYDIA PROBE AMP (~~LOC~~) NOT AT ARMC
Chlamydia: NEGATIVE
Comment: NEGATIVE
Comment: NORMAL
Neisseria Gonorrhea: NEGATIVE

## 2023-09-21 ENCOUNTER — Encounter: Payer: Self-pay | Admitting: Advanced Practice Midwife

## 2023-10-29 ENCOUNTER — Other Ambulatory Visit: Payer: Self-pay | Admitting: Optometry

## 2023-10-29 ENCOUNTER — Other Ambulatory Visit: Payer: Self-pay | Admitting: Ophthalmology

## 2023-10-29 DIAGNOSIS — S022XXA Fracture of nasal bones, initial encounter for closed fracture: Secondary | ICD-10-CM

## 2023-11-07 ENCOUNTER — Ambulatory Visit
Admission: RE | Admit: 2023-11-07 | Discharge: 2023-11-07 | Disposition: A | Discharge status: Home / Self Care | Source: Ambulatory Visit | Attending: Optometry | Admitting: Optometry

## 2023-11-07 DIAGNOSIS — S022XXA Fracture of nasal bones, initial encounter for closed fracture: Secondary | ICD-10-CM
